# Patient Record
Sex: Female | Born: 1940 | Race: White | Hispanic: No | Marital: Single | Smoking: Current every day smoker
Health system: Southern US, Community
[De-identification: ages and names within clinical notes are randomized; demographics above are authoritative.]

## PROBLEM LIST (undated history)

## (undated) DIAGNOSIS — C3490 Malignant neoplasm of unspecified part of unspecified bronchus or lung: Secondary | ICD-10-CM

## (undated) DIAGNOSIS — Z923 Personal history of irradiation: Secondary | ICD-10-CM

## (undated) HISTORY — DX: Personal history of irradiation: Z92.3

## (undated) HISTORY — PX: BACK SURGERY: SHX140

---

## 2016-07-30 ENCOUNTER — Emergency Department (HOSPITAL_BASED_OUTPATIENT_CLINIC_OR_DEPARTMENT_OTHER): Payer: Medicare Other

## 2016-07-30 ENCOUNTER — Encounter (HOSPITAL_BASED_OUTPATIENT_CLINIC_OR_DEPARTMENT_OTHER): Payer: Self-pay

## 2016-07-30 ENCOUNTER — Inpatient Hospital Stay (HOSPITAL_BASED_OUTPATIENT_CLINIC_OR_DEPARTMENT_OTHER)
Admission: EM | Admit: 2016-07-30 | Discharge: 2016-08-03 | DRG: 054 | Disposition: A | Discharge status: Home / Self Care | Payer: Medicare Other | Attending: Internal Medicine | Admitting: Internal Medicine

## 2016-07-30 DIAGNOSIS — G131 Other systemic atrophy primarily affecting central nervous system in neoplastic disease: Secondary | ICD-10-CM | POA: Diagnosis present

## 2016-07-30 DIAGNOSIS — Z8542 Personal history of malignant neoplasm of other parts of uterus: Secondary | ICD-10-CM | POA: Diagnosis not present

## 2016-07-30 DIAGNOSIS — C7951 Secondary malignant neoplasm of bone: Secondary | ICD-10-CM | POA: Diagnosis present

## 2016-07-30 DIAGNOSIS — R4702 Dysphasia: Secondary | ICD-10-CM | POA: Diagnosis present

## 2016-07-30 DIAGNOSIS — F17210 Nicotine dependence, cigarettes, uncomplicated: Secondary | ICD-10-CM | POA: Diagnosis present

## 2016-07-30 DIAGNOSIS — Z7951 Long term (current) use of inhaled steroids: Secondary | ICD-10-CM | POA: Diagnosis not present

## 2016-07-30 DIAGNOSIS — F419 Anxiety disorder, unspecified: Secondary | ICD-10-CM | POA: Diagnosis present

## 2016-07-30 DIAGNOSIS — C3411 Malignant neoplasm of upper lobe, right bronchus or lung: Secondary | ICD-10-CM | POA: Diagnosis present

## 2016-07-30 DIAGNOSIS — R4781 Slurred speech: Secondary | ICD-10-CM | POA: Diagnosis present

## 2016-07-30 DIAGNOSIS — C787 Secondary malignant neoplasm of liver and intrahepatic bile duct: Secondary | ICD-10-CM | POA: Diagnosis present

## 2016-07-30 DIAGNOSIS — R1319 Other dysphagia: Secondary | ICD-10-CM | POA: Diagnosis not present

## 2016-07-30 DIAGNOSIS — G936 Cerebral edema: Secondary | ICD-10-CM | POA: Diagnosis present

## 2016-07-30 DIAGNOSIS — C7931 Secondary malignant neoplasm of brain: Secondary | ICD-10-CM | POA: Diagnosis not present

## 2016-07-30 DIAGNOSIS — G893 Neoplasm related pain (acute) (chronic): Secondary | ICD-10-CM | POA: Diagnosis present

## 2016-07-30 DIAGNOSIS — Z90710 Acquired absence of both cervix and uterus: Secondary | ICD-10-CM | POA: Diagnosis not present

## 2016-07-30 DIAGNOSIS — K224 Dyskinesia of esophagus: Secondary | ICD-10-CM | POA: Diagnosis present

## 2016-07-30 DIAGNOSIS — G9340 Encephalopathy, unspecified: Secondary | ICD-10-CM | POA: Diagnosis not present

## 2016-07-30 DIAGNOSIS — R1310 Dysphagia, unspecified: Secondary | ICD-10-CM

## 2016-07-30 DIAGNOSIS — R471 Dysarthria and anarthria: Secondary | ICD-10-CM | POA: Diagnosis present

## 2016-07-30 DIAGNOSIS — Z91041 Radiographic dye allergy status: Secondary | ICD-10-CM

## 2016-07-30 DIAGNOSIS — R9089 Other abnormal findings on diagnostic imaging of central nervous system: Secondary | ICD-10-CM | POA: Diagnosis present

## 2016-07-30 DIAGNOSIS — R410 Disorientation, unspecified: Secondary | ICD-10-CM

## 2016-07-30 DIAGNOSIS — J449 Chronic obstructive pulmonary disease, unspecified: Secondary | ICD-10-CM | POA: Diagnosis present

## 2016-07-30 DIAGNOSIS — T380X5A Adverse effect of glucocorticoids and synthetic analogues, initial encounter: Secondary | ICD-10-CM | POA: Diagnosis present

## 2016-07-30 DIAGNOSIS — C3490 Malignant neoplasm of unspecified part of unspecified bronchus or lung: Secondary | ICD-10-CM | POA: Diagnosis present

## 2016-07-30 DIAGNOSIS — I10 Essential (primary) hypertension: Secondary | ICD-10-CM | POA: Diagnosis present

## 2016-07-30 DIAGNOSIS — Z79899 Other long term (current) drug therapy: Secondary | ICD-10-CM

## 2016-07-30 DIAGNOSIS — R739 Hyperglycemia, unspecified: Secondary | ICD-10-CM | POA: Diagnosis present

## 2016-07-30 DIAGNOSIS — F329 Major depressive disorder, single episode, unspecified: Secondary | ICD-10-CM | POA: Diagnosis present

## 2016-07-30 DIAGNOSIS — R4789 Other speech disturbances: Secondary | ICD-10-CM | POA: Diagnosis not present

## 2016-07-30 DIAGNOSIS — J910 Malignant pleural effusion: Secondary | ICD-10-CM | POA: Diagnosis present

## 2016-07-30 DIAGNOSIS — R4701 Aphasia: Secondary | ICD-10-CM | POA: Diagnosis present

## 2016-07-30 HISTORY — DX: Malignant neoplasm of unspecified part of unspecified bronchus or lung: C34.90

## 2016-07-30 LAB — DIFFERENTIAL
BASOS ABS: 0 10*3/uL (ref 0.0–0.1)
Basophils Relative: 0 %
Eosinophils Absolute: 0.2 10*3/uL (ref 0.0–0.7)
Eosinophils Relative: 2 %
LYMPHS ABS: 2.5 10*3/uL (ref 0.7–4.0)
LYMPHS PCT: 25 %
MONOS PCT: 7 %
Monocytes Absolute: 0.7 10*3/uL (ref 0.1–1.0)
NEUTROS ABS: 6.8 10*3/uL (ref 1.7–7.7)
Neutrophils Relative %: 66 %

## 2016-07-30 LAB — COMPREHENSIVE METABOLIC PANEL
ALK PHOS: 54 U/L (ref 38–126)
ALT: 11 U/L — AB (ref 14–54)
AST: 19 U/L (ref 15–41)
Albumin: 3.7 g/dL (ref 3.5–5.0)
Anion gap: 10 (ref 5–15)
BILIRUBIN TOTAL: 0.3 mg/dL (ref 0.3–1.2)
BUN: 19 mg/dL (ref 6–20)
CHLORIDE: 101 mmol/L (ref 101–111)
CO2: 27 mmol/L (ref 22–32)
CREATININE: 0.84 mg/dL (ref 0.44–1.00)
Calcium: 9.1 mg/dL (ref 8.9–10.3)
GFR calc Af Amer: >60 mL/min (ref >60)
Glucose, Bld: 148 mg/dL — ABNORMAL HIGH (ref 65–99)
Potassium: 4.1 mmol/L (ref 3.5–5.1)
Sodium: 138 mmol/L (ref 135–145)
TOTAL PROTEIN: 7.1 g/dL (ref 6.5–8.1)

## 2016-07-30 LAB — PROTIME-INR
INR: 0.97
Prothrombin Time: 12.9 seconds (ref 11.4–15.2)

## 2016-07-30 LAB — URINALYSIS, ROUTINE W REFLEX MICROSCOPIC
BILIRUBIN URINE: NEGATIVE
GLUCOSE, UA: NEGATIVE mg/dL
HGB URINE DIPSTICK: NEGATIVE
Ketones, ur: 5 mg/dL — AB
Leukocytes, UA: NEGATIVE
Nitrite: NEGATIVE
PH: 5 (ref 5.0–8.0)
Protein, ur: NEGATIVE mg/dL
SPECIFIC GRAVITY, URINE: 1.016 (ref 1.005–1.030)

## 2016-07-30 LAB — RAPID URINE DRUG SCREEN, HOSP PERFORMED
Amphetamines: NOT DETECTED
BARBITURATES: NOT DETECTED
Benzodiazepines: NOT DETECTED
Cocaine: NOT DETECTED
Opiates: POSITIVE — AB
TETRAHYDROCANNABINOL: NOT DETECTED

## 2016-07-30 LAB — TROPONIN I

## 2016-07-30 LAB — ETHANOL: Alcohol, Ethyl (B): <5 mg/dL (ref <5)

## 2016-07-30 LAB — CBG MONITORING, ED: Glucose-Capillary: 139 mg/dL — ABNORMAL HIGH (ref 65–99)

## 2016-07-30 LAB — CBC
HEMATOCRIT: 35.8 % — AB (ref 36.0–46.0)
HEMOGLOBIN: 11.6 g/dL — AB (ref 12.0–15.0)
MCH: 31.1 pg (ref 26.0–34.0)
MCHC: 32.4 g/dL (ref 30.0–36.0)
MCV: 96 fL (ref 78.0–100.0)
Platelets: 224 10*3/uL (ref 150–400)
RBC: 3.73 MIL/uL — ABNORMAL LOW (ref 3.87–5.11)
RDW: 13.5 % (ref 11.5–15.5)
WBC: 10.3 10*3/uL (ref 4.0–10.5)

## 2016-07-30 LAB — APTT: APTT: 40 s — AB (ref 24–36)

## 2016-07-30 MED ORDER — SODIUM CHLORIDE 0.9% FLUSH: 3.0000 mL | INTRAVENOUS | Status: DC | PRN

## 2016-07-30 MED ORDER — FENTANYL 25 MCG/HR TD PT72
25.0000 ug | MEDICATED_PATCH | TRANSDERMAL | Status: DC
  Administered 2016-07-30 – 2016-08-02 (×2): 25 ug via TRANSDERMAL
  Filled 2016-07-30 (×2): qty 1

## 2016-07-30 MED ORDER — ALPRAZOLAM 0.5 MG PO TABS
0.5000 mg | ORAL_TABLET | Freq: Once | ORAL | Status: AC
  Administered 2016-07-31: 0.5 mg via ORAL
  Filled 2016-07-30: qty 1

## 2016-07-30 MED ORDER — PREGABALIN 75 MG PO CAPS
75.0000 mg | ORAL_CAPSULE | Freq: Two times a day (BID) | ORAL | Status: DC
  Administered 2016-07-30 – 2016-08-03 (×8): 75 mg via ORAL
  Filled 2016-07-30 (×8): qty 1

## 2016-07-30 MED ORDER — DEXAMETHASONE SODIUM PHOSPHATE 10 MG/ML IJ SOLN
10.0000 mg | Freq: Once | INTRAMUSCULAR | Status: AC
  Administered 2016-07-30: 10 mg via INTRAVENOUS
  Filled 2016-07-30: qty 1

## 2016-07-30 MED ORDER — HYDROCODONE-ACETAMINOPHEN 7.5-325 MG PO TABS
1.0000 | ORAL_TABLET | Freq: Four times a day (QID) | ORAL | Status: DC | PRN
  Administered 2016-07-31 – 2016-08-02 (×5): 1 via ORAL
  Filled 2016-07-30 (×5): qty 1

## 2016-07-30 MED ORDER — SODIUM CHLORIDE 0.9% FLUSH
3.0000 mL | Freq: Two times a day (BID) | INTRAVENOUS | Status: DC
  Administered 2016-07-30 – 2016-08-03 (×8): 3 mL via INTRAVENOUS

## 2016-07-30 MED ORDER — DEXAMETHASONE SODIUM PHOSPHATE 10 MG/ML IJ SOLN
10.0000 mg | Freq: Four times a day (QID) | INTRAMUSCULAR | Status: AC
  Administered 2016-07-30 – 2016-08-02 (×13): 10 mg via INTRAVENOUS
  Filled 2016-07-30 (×14): qty 1

## 2016-07-30 MED ORDER — BUSPIRONE HCL 5 MG PO TABS
10.0000 mg | ORAL_TABLET | Freq: Two times a day (BID) | ORAL | Status: DC | PRN
  Administered 2016-08-01: 10 mg via ORAL
  Filled 2016-07-30: qty 2

## 2016-07-30 MED ORDER — MONTELUKAST SODIUM 10 MG PO TABS
10.0000 mg | ORAL_TABLET | Freq: Every day | ORAL | Status: DC
  Administered 2016-07-31 – 2016-08-03 (×4): 10 mg via ORAL
  Filled 2016-07-30 (×4): qty 1

## 2016-07-30 MED ORDER — SODIUM CHLORIDE 0.9 % IV SOLN: 250.0000 mL | INTRAVENOUS | Status: DC | PRN

## 2016-07-30 MED ORDER — DULOXETINE HCL 60 MG PO CPEP
60.0000 mg | ORAL_CAPSULE | Freq: Every day | ORAL | Status: DC
  Administered 2016-07-31 – 2016-08-03 (×4): 60 mg via ORAL
  Filled 2016-07-30 (×4): qty 1

## 2016-07-30 NOTE — ED Notes (Signed)
Report to RN at [facility].

## 2016-07-30 NOTE — Progress Notes (Signed)
[] Hide copied text [] Hover for attribution information  [place]  [phone] [phone]     ID: Ashley Rowe DOB: [birth year]  MR#: [id number]  CNO#:[id number]  Patient Care Team: Henderson Baltimore, FNP as PCP - General (Nurse Practitioner) Chauncey Cruel, MD OTHER MD:  CHIEF COMPLAINT: brain metastases  CURRENT TREATMENT: dexamethasone; awaiting radiaiton therapy   HISTORY OF CURRENT ILLNESS: Ashley Rowe was living near [place], [place] when she developed worsening SOB and cough. After ABX therapy failed to clear the presumed pneumonia a chest Ct showed R hilar and RUL masses, extensive regional adnopathy, and apparent involvement of the liver and bones. 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 Clinically this was T4 N3 M1 at presentation. She was treated with standard carboplatin/ etoposide for 6 cycles, completed 04/16/2016 and most recent staging studies (Chest/Abd/Pelvis CT and bone scan 07/21/2016) showed post-treatment changes in the Right lung, an enlarging but still small/moderate Right effusion, multiple bone lesions and no obvious liver involvement.   I do not find a prior head CT scan or MRI  The patient is now living with her daughter Ashley Rowe in [place]. This AM Ashley Rowe thought her mother seemed slightly confused early AM. Ashley Rowe went to work but returned home at noon and by that time her mother's confusion was more pronounced. She was evaluated in the ED in [facility] and referred to [facility] for admission since there was no availability in [facility]. Here non-contrast head CT scanning found extensive cerebral edema with underlying isodense masses suspected, 3  Mm left to right shift, likely skull involvement, no bleed.  We were consulted to facilitate further management  INTERVAL HISTORY: I met with the patient's daughter Ashley Rowe as the patient herself had been sedated.  REVIEW OF SYSTEMS: As noted above. Dr Huff's  note from 07/23/2016 lists chronic back pain, anxiety, alcoholic polyneuropathy, and ongoing tobacco abuse.  PAST MEDICAL HISTORY:     Past Medical History:  Diagnosis Date  . Lung cancer [facility])   Remote endometrial cancer, s/p hysterectomy  PAST SURGICAL HISTORY:      Past Surgical History:  Procedure Laterality Date  . BACK SURGERY      FAMILY HISTORY No family history on file.  The patient's father committed suicide when the patient was 76 y/o. The patient's mother died from an overdose (unknown medication) age 74. The patient has one brother, with a history of what sounds like a primary CNS tumor (recently recurrent) and a sister, both living in [place]:  The patient lived in [place], near [place], [place], until she moved in with her daughter Ashley Rowe in [place]. The patient is divorced. 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 The patient had a son who was murdered. The patient has one grandchild and two great grandchildren.                          ADVANCED DIRECTIVES: not in place   HEALTH MAINTENANCE:     Social History  Substance Use Topics  . Smoking status: Current Every Day Smoker  . Smokeless tobacco: Never Used  . Alcohol use No                      Allergies  Allergen Reactions  . Ivp Dye [Iodinated Diagnostic Agents]  Current Facility-Administered Medications  Medication Dose Route Frequency Provider Last Rate Last Dose  . 0.9 %  sodium chloride infusion  250 mL Intravenous PRN Phillips Grout, MD      . ALPRAZolam Duanne Moron) tablet 0.5 mg  0.5 mg Oral Once Phillips Grout, MD      . busPIRone (BUSPAR) tablet 10 mg  10 mg Oral BID PRN Phillips Grout, MD      . dexamethasone (DECADRON) injection 10 mg  10 mg Intravenous Q6H Phillips Grout, MD      . Derrill Memo ON 07/31/2016] DULoxetine (CYMBALTA) DR capsule 60 mg  60 mg  Oral Daily Derrill Kay A, MD      . fentaNYL ([facility] - dosed mcg/hr) patch 25 mcg  25 mcg Transdermal Q72H Phillips Grout, MD      . HYDROcodone-acetaminophen ([place]) 7.5-325 MG per tablet 1 tablet  1 tablet Oral Q6H PRN Phillips Grout, MD      . Derrill Memo ON 07/31/2016] montelukast (SINGULAIR) tablet 10 mg  10 mg Oral Daily Derrill Kay A, MD      . pregabalin (LYRICA) capsule 75 mg  75 mg Oral BID Derrill Kay A, MD      . sodium chloride flush (NS) 0.9 % injection 3 mL  3 mL Intravenous Q12H David, Rachal A, MD      . sodium chloride flush (NS) 0.9 % injection 3 mL  3 mL Intravenous PRN Phillips Grout, MD        OBJECTIVE: elderly woman evaluated in bed     Vitals:   07/30/16 1800 07/30/16 1915  BP: 129/70 (!) 156/76  Pulse: 75 81  Resp: 14 16  Temp:  99.3 F (37.4 C)     Body mass index is 26.4 kg/m.       LAB RESULTS:  CMP     Labs (Brief)          Component Value Date/Time   NA 138 07/30/2016 1459   K 4.1 07/30/2016 1459   CL 101 07/30/2016 1459   CO2 27 07/30/2016 1459   GLUCOSE 148 (H) 07/30/2016 1459   BUN 19 07/30/2016 1459   CREATININE 0.84 07/30/2016 1459   CALCIUM 9.1 07/30/2016 1459   PROT 7.1 07/30/2016 1459   ALBUMIN 3.7 07/30/2016 1459   AST 19 07/30/2016 1459   ALT 11 (L) 07/30/2016 1459   ALKPHOS 54 07/30/2016 1459   BILITOT 0.3 07/30/2016 1459   GFRNONAA >60 07/30/2016 1459   GFRAA >60 07/30/2016 1459      Recent Labs  No results found for: TOTALPROTELP, ALBUMINELP, A1GS, A2GS, BETS, BETA2SER, GAMS, MSPIKE, SPEI    Recent Labs  No results found for: KPAFRELGTCHN, LAMBDASER, KAPLAMBRATIO    Recent Labs       Lab Results  Component Value Date   WBC 10.3 07/30/2016   NEUTROABS 6.8 07/30/2016   HGB 11.6 (L) 07/30/2016   HCT 35.8 (L) 07/30/2016   MCV 96.0 07/30/2016   PLT 224 07/30/2016      @LASTCHEMISTRY @  Recent Labs  No results found for: LABCA2    Recent Labs  No components found  for: [id number]     Last Labs    Recent Labs Lab 07/30/16 1459  INR 0.97      Urinalysis Labs (Brief)  No results found for: COLORURINE, APPEARANCEUR, LABSPEC, PHURINE, GLUCOSEU, HGBUR, BILIRUBINUR, KETONESUR, PROTEINUR, UROBILINOGEN, NITRITE, LEUKOCYTESUR     STUDIES:  Imaging Results  Ct Head Wo Contrast  Result Date: 07/30/2016 CLINICAL DATA:  Slurred speech with unsteady gait.  Lung cancer. EXAM: CT HEAD WITHOUT CONTRAST TECHNIQUE: Contiguous axial images were obtained from the base of the skull through the vertex without intravenous contrast. COMPARISON:  None. FINDINGS: Brain: Widespread areas of vasogenic edema are seen throughout both cerebral hemispheres, or extensive on the LEFT. There are suspected underlying isodense masses, representing metastatic disease the brain. There may be early edema in the RIGHT cerebellum. Early LEFT-to-RIGHT shift supratentorial midline, estimated 2-3 mm. No hemorrhage. Small calcification LEFT middle cerebellar peduncle, uncertain significance. Chronic lacunar infarct LEFT thalamus. No incipient herniation. Vascular: No hyperdense vessel or unexpected calcification. Skull: Permeative change in the diploic space of of the calvarium, concerning for metastatic disease. Permeative change also appears to involve clivus. Sinuses/Orbits: No layering fluid orbital mass. Other: None. IMPRESSION: Widespread areas of vasogenic edema on this noncontrast exam, suspected intracranial metastatic disease from lung cancer. MRI brain without and with contrast recommended for further evaluation. Findings discussed with ordering provider team. Suspected osseous disease to the calvarium and skull base. Electronically Signed   By: Staci Righter M.D.   On: 07/30/2016 15:25       ASSESSMENT: 76 y.o. [place], [place] woman admitted with confusion, new brain metastases, with a history of small cell lung cancer metastatic at presentation September 2017, treated with  standard chemotherapy (carboplatin/etoposide x6), last dose January 2018, now with extensive metastatic involvement of the brain.   PLAN: Agree with dexamethasone as ordered. Brain MRI is pending.  I discussed with the patient's daughter that Ashley Rowe cancer is not curable but it may be treatable, the treatment consisting of whole brain radiation.We discussed the possibility of worsening cognitive function or dementia aggravated by the treatment. We discussed the fact that the patient has no advanced directives and declaring a HCPOA in particular will be important.  Ashley Rowe feels she is likely to be the [facility] and that seems right as the patient has chosen to move in with her. This will have to be formalized and I have placed a SW consult to get this done.  I have also placed a radiation oncology consult and will call them in AM to alert them re this admission. However the patient's daughter is not sure she or her mother will want to be treated here-- they have an established oncologist, Dr Harlow Asa, locally and they may wish to have him continue to manage her care. I am copying him on this note.  Will follow with you while in house.    Chauncey Cruel, MD   07/30/2016 9:06 PM Medical Oncology and Hematology [facility] [address] [place], [place] [address] Tel. [phone] Fax. [phone]

## 2016-07-30 NOTE — H&P (Signed)
History and Physical    Ashley Rowe KYH:[id number] DOB: Jul 29, 1940 DOA: 07/30/2016  PCP: Henderson Baltimore, FNP  Patient coming from: home  Chief Complaint:  Trouble speaking  HPI: Ashley Rowe is a 76 y.o. female with medical history significant of lung cancer [place] stage 3 she thinks finished in jan 2017 comes in with trouble speaking since yesterday.  She denies any other neurological symptoms.  Not drooling.  No numbness/tingling anywhere.  No headache or blurred vision.  No trouble swallowing.  She went to [facility] and ct shows several lesions concerning for metastatic  Disease with associated edema.  Pt therefore transferred here for further treatment and evaluation.   Review of Systems: As per HPI otherwise 10 point review of systems negative.   Past Medical History:  Diagnosis Date  . Lung cancer [facility])     Past Surgical History:  Procedure Laterality Date  . BACK SURGERY       reports that she has been smoking.  She has never used smokeless tobacco. She reports that she does not drink alcohol or use drugs.  Allergies  Allergen Reactions  . Ivp Dye [Iodinated Diagnostic Agents]     No family history on file.  No premature CAD  Prior to Admission medications   Medication Sig Start Date End Date Taking? Authorizing Provider  ALPRAZolam Duanne Moron) 0.5 MG tablet Take 0.5 mg by mouth 2 (two) times daily.   Yes [provider]  budesonide-formoterol (SYMBICORT) 160-4.5 MCG/ACT inhaler Inhale 2 puffs into the lungs 2 (two) times daily.   Yes [provider]  DULoxetine HCl (CYMBALTA PO) Take by mouth.   Yes [provider]  fentaNYL (DURAGESIC - DOSED MCG/HR) 25 MCG/HR patch Place 25 mcg onto the skin every 3 (three) days.   Yes [provider]  HYDROcodone-acetaminophen ([place]CO) 7.5-325 MG tablet Take 1 tablet by mouth every 6 (six) hours as needed for moderate pain.   Yes [provider]  montelukast (SINGULAIR) 10 MG  tablet Take 10 mg by mouth at bedtime.   Yes [provider]  Multiple Vitamins-Minerals (MULTIVITAMIN ADULTS PO) Take by mouth.   Yes [provider]  pregabalin (LYRICA) 75 MG capsule Take 75 mg by mouth 2 (two) times daily.   Yes [provider]    Physical Exam: Vitals:   07/30/16 1700 07/30/16 1745 07/30/16 1800 07/30/16 1915  BP: (!) 147/89 (!) 152/73 129/70 (!) 156/76  Pulse: 70 75 75 81  Resp: '12 13 14 16  '$ Temp:    99.3 F (37.4 C)  TempSrc:    Oral  SpO2: 96% 94% 95% 96%  Weight:    67.6 kg (149 lb 0.5 oz)  Height:    '5\' 3"'$  (1.6 m)      Constitutional: NAD, calm, comfortable Vitals:   07/30/16 1700 07/30/16 1745 07/30/16 1800 07/30/16 1915  BP: (!) 147/89 (!) 152/73 129/70 (!) 156/76  Pulse: 70 75 75 81  Resp: '12 13 14 16  '$ Temp:    99.3 F (37.4 C)  TempSrc:    Oral  SpO2: 96% 94% 95% 96%  Weight:    67.6 kg (149 lb 0.5 oz)  Height:    '5\' 3"'$  (1.6 m)   Eyes: PERRL, lids and conjunctivae normal ENMT: Mucous membranes are moist. Posterior pharynx clear of any exudate or lesions.Normal dentition.  Neck: normal, supple, no masses, no thyromegaly Respiratory: clear to auscultation bilaterally, no wheezing, no crackles. Normal respiratory effort. No accessory muscle  use.  Cardiovascular: Regular rate and rhythm, no murmurs / rubs / gallops. No extremity edema. 2+ pedal pulses. No carotid bruits.  Abdomen: no tenderness, no masses palpated. No hepatosplenomegaly. Bowel sounds positive.  Musculoskeletal: no clubbing / cyanosis. No joint deformity upper and lower extremities. Good ROM, no contractures. Normal muscle tone.  Skin: no rashes, lesions, ulcers. No induration Neurologic: CN 2-12 grossly intact. Sensation intact, DTR normal. Strength 5/5 in all 4.  Psychiatric: Normal judgment and insight. Alert and oriented x 3. Normal mood.    Labs on Admission: I have personally reviewed following labs and imaging studies  CBC:  Recent Labs Lab  07/30/16 1459  WBC 10.3  NEUTROABS 6.8  HGB 11.6*  HCT 35.8*  MCV 96.0  PLT 425   Basic Metabolic Panel:  Recent Labs Lab 07/30/16 1459  NA 138  K 4.1  CL 101  CO2 27  GLUCOSE 148*  BUN 19  CREATININE 0.84  CALCIUM 9.1   GFR: Estimated Creatinine Clearance: 51.8 mL/min (by C-G formula based on SCr of 0.84 mg/dL). Liver Function Tests:  Recent Labs Lab 07/30/16 1459  AST 19  ALT 11*  ALKPHOS 54  BILITOT 0.3  PROT 7.1  ALBUMIN 3.7   No results for input(s): LIPASE, AMYLASE in the last 168 hours. No results for input(s): AMMONIA in the last 168 hours. Coagulation Profile:  Recent Labs Lab 07/30/16 1459  INR 0.97   Cardiac Enzymes:  Recent Labs Lab 07/30/16 1459  TROPONINI <0.03   BNP (last 3 results) No results for input(s): PROBNP in the last 8760 hours. HbA1C: No results for input(s): HGBA1C in the last 72 hours. CBG:  Recent Labs Lab 07/30/16 1457  GLUCAP 139*   Lipid Profile: No results for input(s): CHOL, HDL, LDLCALC, TRIG, CHOLHDL, LDLDIRECT in the last 72 hours. Thyroid Function Tests: No results for input(s): TSH, T4TOTAL, FREET4, T3FREE, THYROIDAB in the last 72 hours. Anemia Panel: No results for input(s): VITAMINB12, FOLATE, FERRITIN, TIBC, IRON, RETICCTPCT in the last 72 hours. Urine analysis: No results found for: COLORURINE, APPEARANCEUR, LABSPEC, PHURINE, GLUCOSEU, HGBUR, BILIRUBINUR, KETONESUR, PROTEINUR, UROBILINOGEN, NITRITE, LEUKOCYTESUR Sepsis Labs: !!!!!!!!!!!!!!!!!!!!!!!!!!!!!!!!!!!!!!!!!!!! '@LABRCNTIP'$ (procalcitonin:4,lacticidven:4) )No results found for this or any previous visit (from the past 240 hour(s)).   Radiological Exams on Admission: Ct Head Wo Contrast  Result Date: 07/30/2016 CLINICAL DATA:  Slurred speech with unsteady gait.  Lung cancer. EXAM: CT HEAD WITHOUT CONTRAST TECHNIQUE: Contiguous axial images were obtained from the base of the skull through the vertex without intravenous contrast. COMPARISON:   None. FINDINGS: Brain: Widespread areas of vasogenic edema are seen throughout both cerebral hemispheres, or extensive on the LEFT. There are suspected underlying isodense masses, representing metastatic disease the brain. There may be early edema in the RIGHT cerebellum. Early LEFT-to-RIGHT shift supratentorial midline, estimated 2-3 mm. No hemorrhage. Small calcification LEFT middle cerebellar peduncle, uncertain significance. Chronic lacunar infarct LEFT thalamus. No incipient herniation. Vascular: No hyperdense vessel or unexpected calcification. Skull: Permeative change in the diploic space of of the calvarium, concerning for metastatic disease. Permeative change also appears to involve clivus. Sinuses/Orbits: No layering fluid orbital mass. Other: None. IMPRESSION: Widespread areas of vasogenic edema on this noncontrast exam, suspected intracranial metastatic disease from lung cancer. MRI brain without and with contrast recommended for further evaluation. Findings discussed with ordering provider team. Suspected osseous disease to the calvarium and skull base. Electronically Signed   By: Staci Righter M.D.   On: 07/30/2016 15:25    Labs reviewed from [place]  records from [place] requested Case discussed with on call oncologist dr Jana Hakim  Assessment/Plan 76 yo female with h/o stage 2 lung cancer comes in with aphasia found to have new lesions with associated vasogenic edema on ct head  Principal Problem:   Slurred speech- due to edema /lesions.  Obtain MRI brain, pt requesting sedation for mri.  decadron  Active Problems:   Abnormal CT of brain- as noted   Vasogenic brain edema ([place])- have spoken to oncology on call, place on decadron.  Onc to see for further evaluation for need for XRT to brain   Lung cancer [facility])- obtain records from [place] [facility]     DVT prophylaxis: scds Code Status: full  Family Communication: none  Disposition Plan:  Per day team Consults called:   Oncology dr Jana Hakim called by myself Admission status:  admission   Delano Frate A MD [facility]  If 7PM-7AM, please contact night-coverage [email or web address] Password [facility]  07/30/2016, 7:47 PM

## 2016-07-30 NOTE — ED Provider Notes (Signed)
[facility] DEPT [facility] Provider Note   CSN: [id number] Arrival date & time: 07/30/16  1446     History   Chief Complaint Chief Complaint  Patient presents with  . Aphasia    HPI Ashley Rowe is a 76 y.o. female.  HPI  Patient is a 76 year old femalewioth PMH sig for lung cancer presenting with difficulty speaking. Patient has slurred speech today. Patient has no trouble word finding but does have slurred speech that is compatible. Patient is brought in by her daughter who she lives with. Patient daughter reports that the speech is abnormal she woke up this morning at 9 AM. Patient reports that she thinks it was abnormal last night as well. Patient said that she thinks is been abnormal for last couple weeks. Outside of the stroke TPA window.     Past Medical History:  Diagnosis Date  . Lung cancer ([place])     There are no active problems to display for this patient.   Past Surgical History:  Procedure Laterality Date  . BACK SURGERY      OB History    No data available       Home Medications    Prior to Admission medications   Not on File    Family History No family history on file.  Social History Social History  Substance Use Topics  . Smoking status: Current Every Day Smoker  . Smokeless tobacco: Never Used  . Alcohol use No     Allergies   Ivp dye [iodinated diagnostic agents]   Review of Systems Review of Systems  Constitutional: Negative for activity change.  Respiratory: Negative for shortness of breath.   Cardiovascular: Negative for chest pain.  Gastrointestinal: Negative for abdominal pain.  Neurological: Positive for speech difficulty. Negative for dizziness, facial asymmetry, weakness, light-headedness and numbness.     Physical Exam Updated Vital Signs BP (!) 146/69 (BP Location: Right Arm)   Pulse 82   Resp 20   Wt 153 lb (69.4 kg)   SpO2 97%   Physical Exam  Constitutional: She is oriented to person, place, and time.  She appears well-developed and well-nourished.  HENT:  Head: Normocephalic and atraumatic.  Eyes: Right eye exhibits no discharge.  Cardiovascular: Normal rate, regular rhythm and normal heart sounds.   No murmur heard. Pulmonary/Chest: Effort normal and breath sounds normal. She has no wheezes. She has no rales.  Abdominal: Soft. She exhibits no distension. There is no tenderness.  Neurological: She is oriented to person, place, and time.  Speech slurred.e, no slurringEqual strength bilaterally upper and lower extremities negative pronator drift. Normal sensation bilaterally. . Facial nerve tested and appears grossly normal. Alert and oriented 3.   Skin: Skin is warm and dry. She is not diaphoretic.  Psychiatric: She has a normal mood and affect.  Nursing note and vitals reviewed.    ED Treatments / Results  Labs (all labs ordered are listed, but only abnormal results are displayed) Labs Reviewed  APTT - Abnormal; Notable for the following:       Result Value   aPTT 40 (*)    All other components within normal limits  CBC - Abnormal; Notable for the following:    RBC 3.73 (*)    Hemoglobin 11.6 (*)    HCT 35.8 (*)    All other components within normal limits  CBG MONITORING, ED - Abnormal; Notable for the following:    Glucose-Capillary 139 (*)    All other components within normal limits  ETHANOL  PROTIME-INR  DIFFERENTIAL  COMPREHENSIVE METABOLIC PANEL  RAPID URINE DRUG SCREEN, HOSP PERFORMED  URINALYSIS, ROUTINE W REFLEX MICROSCOPIC  TROPONIN I    EKG  EKG Interpretation  Date/Time:  Thursday Jul 30 2016 14:56:02 EDT Ventricular Rate:  84 PR Interval:    QRS Duration: 87 QT Interval:  375 QTC Calculation: 444 R Axis:   81 Text Interpretation:  Sinus rhythm Borderline right axis deviation No previous tracing Confirmed by Maryan Rued  MD, Loree Fee ([id number]) on 07/30/2016 3:03:27 PM       Radiology Ct Head Wo Contrast  Result Date: 07/30/2016 CLINICAL DATA:   Slurred speech with unsteady gait.  Lung cancer. EXAM: CT HEAD WITHOUT CONTRAST TECHNIQUE: Contiguous axial images were obtained from the base of the skull through the vertex without intravenous contrast. COMPARISON:  None. FINDINGS: Brain: Widespread areas of vasogenic edema are seen throughout both cerebral hemispheres, or extensive on the LEFT. There are suspected underlying isodense masses, representing metastatic disease the brain. There may be early edema in the RIGHT cerebellum. Early LEFT-to-RIGHT shift supratentorial midline, estimated 2-3 mm. No hemorrhage. Small calcification LEFT middle cerebellar peduncle, uncertain significance. Chronic lacunar infarct LEFT thalamus. No incipient herniation. Vascular: No hyperdense vessel or unexpected calcification. Skull: Permeative change in the diploic space of of the calvarium, concerning for metastatic disease. Permeative change also appears to involve clivus. Sinuses/Orbits: No layering fluid orbital mass. Other: None. IMPRESSION: Widespread areas of vasogenic edema on this noncontrast exam, suspected intracranial metastatic disease from lung cancer. MRI brain without and with contrast recommended for further evaluation. Findings discussed with ordering provider team. Suspected osseous disease to the calvarium and skull base. Electronically Signed   By: Staci Righter M.D.   On: 07/30/2016 15:25    Procedures Procedures (including critical care time)  Medications Ordered in ED Medications  dexamethasone (DECADRON) injection 10 mg (not administered)     Initial Impression / Assessment and Plan / ED Course  I have reviewed the triage vital signs and the nursing notes.  Pertinent labs & imaging results that were available during my care of the patient were reviewed by me and considered in my medical decision making (see chart for details).    Patient 76 year old female with history of lung cancer last radiation done 6 months ago presenting today  with slurred speech. Patient outside stroke window. We'll do CT head, admitt for stroke workup.  3:36 PM CT shows question with mass with edema. Dex given. Pt informted.   Dr. Laverle Hobby is her oncologist at [facility].   Final Clinical Impressions(s) / ED Diagnoses   Final diagnoses:  None    New Prescriptions New Prescriptions   No medications on file     Macarthur Critchley, MD 07/30/16 1536

## 2016-07-30 NOTE — Progress Notes (Signed)
Patient presents with slurred speech. Alert, vitals stable. She will need MRI brain with and without contrast. Will need to consult Oncologist when patient arrives to [place]. Depending on MRI results might need radiation oncologist or neurology consultation. Patient received one dose of IV decadron. She will need further IV doses.   Ashley Devinney, Md.

## 2016-07-30 NOTE — ED Triage Notes (Signed)
Per daughter pt woke approx 830am-at 9am she noticed pt's speech slurred, unsteady gait-daughter left for work when she returned to check on pt her s/s were worse "speech worse, mouth droopy and slobbering"-pt taken to tx area via w/c

## 2016-07-30 NOTE — Consult Note (Signed)
[place]  [phone] [phone]     ID: Ashley Rowe DOB: [birth year]  MR#: [id number]  PPI#:[id number]  Patient Care Team: Henderson Baltimore, FNP as PCP - General (Nurse Practitioner) Chauncey Cruel, MD OTHER MD:  CHIEF COMPLAINT: brain metastases  CURRENT TREATMENT: dexamethasone; awaiting radiaiton therapy   HISTORY OF CURRENT ILLNESS: Ashley Rowe was living near [place], [place] when she developed worsening SOB and cough. After ABX therapy failed to clear the presumed pneumonia a chest Ct showed R hilar and RUL masses, extensive regional adnopathy, and apparent involvement of the liver and bones. 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 Clinically this was T4 N3 M1 at presentation. She was treated with standard carboplatin/ etoposide for 6 cycles, completed 04/16/2016 and most recent staging studies (Chest/Abd/Pelvis CT and bone scan 07/21/2016) showed post-treatment changes in the Right lung, an enlarging but still small/moderate Right effusion, multiple bone lesions and no obvious liver involvement.   I do not find a prior head CT scan or MRI  The patient is now living with her daughter Ashley Rowe in [place]. This AM Ashley Rowe thought her mother seemed slightly confused early AM. Ashley Rowe went to work but returned home at noon and by that time her mother's confusion was more pronounced. She was evaluated in the ED in [facility] and referred to [facility] for admission since there was no availability in H.P. Here non-contrast head CT scanning found extensive cerebral edema with underlying isodense masses suspected, 3  Mm left to right shift, likely skull involvement, no bleed.  We were consulted to facilitate further management  INTERVAL HISTORY: I met with the patient's daughter Ashley Rowe as the patient herself had been sedated.  REVIEW OF SYSTEMS: As noted above. Dr Huff's note from 07/23/2016 lists chronic back pain, anxiety, alcoholic  polyneuropathy, and ongoing tobacco abuse.  PAST MEDICAL HISTORY: Past Medical History:  Diagnosis Date  . Lung cancer [facility])   Remote endometrial cancer, s/p hysterectomy  PAST SURGICAL HISTORY: Past Surgical History:  Procedure Laterality Date  . BACK SURGERY      FAMILY HISTORY No family history on file.  The patient's father committed suicide when the patient was 76 y/o. The patient's mother died from an overdose (unknown medication) age 52. The patient has one brother, with a history of what sounds like a primary CNS tumor (recently recurrent) and a sister, both living in [place]:  The patient lived in [place], near [place], [place], until she moved in with her daughter Ashley Rowe in [place]. The patient is divorced. 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 The patient had a son who was murdered. The patient has one grandchild and two great grandchildren    ADVANCED DIRECTIVES: not in place   HEALTH MAINTENANCE: Social History  Substance Use Topics  . Smoking status: Current Every Day Smoker  . Smokeless tobacco: Never Used  . Alcohol use No       Allergies  Allergen Reactions  . Ivp Dye [Iodinated Diagnostic Agents]     Current Facility-Administered Medications  Medication Dose Route Frequency Provider Last Rate Last Dose  . 0.9 %  sodium chloride infusion  250 mL Intravenous PRN Phillips Grout, MD      . ALPRAZolam Duanne Moron) tablet 0.5 mg  0.5 mg Oral Once Derrill Kay A, MD      . busPIRone (BUSPAR) tablet 10 mg  10 mg  Oral BID PRN Phillips Grout, MD      . dexamethasone (DECADRON) injection 10 mg  10 mg Intravenous Q6H Phillips Grout, MD      . Derrill Memo ON 07/31/2016] DULoxetine (CYMBALTA) DR capsule 60 mg  60 mg Oral Daily Derrill Kay A, MD      . fentaNYL ([place] - dosed mcg/hr) patch 25 mcg  25 mcg Transdermal Q72H Phillips Grout, MD      .  HYDROcodone-acetaminophen ([place]) 7.5-325 MG per tablet 1 tablet  1 tablet Oral Q6H PRN Phillips Grout, MD      . Derrill Memo ON 07/31/2016] montelukast (SINGULAIR) tablet 10 mg  10 mg Oral Daily Derrill Kay A, MD      . pregabalin (LYRICA) capsule 75 mg  75 mg Oral BID Derrill Kay A, MD      . sodium chloride flush (NS) 0.9 % injection 3 mL  3 mL Intravenous Q12H David, Rachal A, MD      . sodium chloride flush (NS) 0.9 % injection 3 mL  3 mL Intravenous PRN Phillips Grout, MD        OBJECTIVE: elderly White woman evaluated in bed Vitals:   07/30/16 1800 07/30/16 1915  BP: 129/70 (!) 156/76  Pulse: 75 81  Resp: 14 16  Temp:  99.3 F (37.4 C)     Body mass index is 26.4 kg/m.       LAB RESULTS:  CMP     Component Value Date/Time   NA 138 07/30/2016 1459   K 4.1 07/30/2016 1459   CL 101 07/30/2016 1459   CO2 27 07/30/2016 1459   GLUCOSE 148 (H) 07/30/2016 1459   BUN 19 07/30/2016 1459   CREATININE 0.84 07/30/2016 1459   CALCIUM 9.1 07/30/2016 1459   PROT 7.1 07/30/2016 1459   ALBUMIN 3.7 07/30/2016 1459   AST 19 07/30/2016 1459   ALT 11 (L) 07/30/2016 1459   ALKPHOS 54 07/30/2016 1459   BILITOT 0.3 07/30/2016 1459   GFRNONAA >60 07/30/2016 1459   GFRAA >60 07/30/2016 1459    No results found for: TOTALPROTELP, ALBUMINELP, A1GS, A2GS, BETS, BETA2SER, GAMS, MSPIKE, SPEI  No results found for: KPAFRELGTCHN, LAMBDASER, KAPLAMBRATIO  Lab Results  Component Value Date   WBC 10.3 07/30/2016   NEUTROABS 6.8 07/30/2016   HGB 11.6 (L) 07/30/2016   HCT 35.8 (L) 07/30/2016   MCV 96.0 07/30/2016   PLT 224 07/30/2016    @LASTCHEMISTRY @  No results found for: LABCA2  No components found for: LTRVUY233   Recent Labs Lab 07/30/16 1459  INR 0.97    Urinalysis No results found for: COLORURINE, APPEARANCEUR, LABSPEC, PHURINE, GLUCOSEU, HGBUR, BILIRUBINUR, KETONESUR, PROTEINUR, UROBILINOGEN, NITRITE, LEUKOCYTESUR   STUDIES: Ct Head Wo Contrast  Result Date:  07/30/2016 CLINICAL DATA:  Slurred speech with unsteady gait.  Lung cancer. EXAM: CT HEAD WITHOUT CONTRAST TECHNIQUE: Contiguous axial images were obtained from the base of the skull through the vertex without intravenous contrast. COMPARISON:  None. FINDINGS: Brain: Widespread areas of vasogenic edema are seen throughout both cerebral hemispheres, or extensive on the LEFT. There are suspected underlying isodense masses, representing metastatic disease the brain. There may be early edema in the RIGHT cerebellum. Early LEFT-to-RIGHT shift supratentorial midline, estimated 2-3 mm. No hemorrhage. Small calcification LEFT middle cerebellar peduncle, uncertain significance. Chronic lacunar infarct LEFT thalamus. No incipient herniation. Vascular: No hyperdense vessel or unexpected calcification. Skull: Permeative change in the diploic space of of the calvarium, concerning for metastatic disease.  Permeative change also appears to involve clivus. Sinuses/Orbits: No layering fluid orbital mass. Other: None. IMPRESSION: Widespread areas of vasogenic edema on this noncontrast exam, suspected intracranial metastatic disease from lung cancer. MRI brain without and with contrast recommended for further evaluation. Findings discussed with ordering provider team. Suspected osseous disease to the calvarium and skull base. Electronically Signed   By: Staci Righter M.D.   On: 07/30/2016 15:25    ASSESSMENT: 76 y.o. [place], [place] woman admitted with confusion, new brain metastases, with a history of small cell lung cancer metastatic at presentation September 2017, treated with standard chemotherapy (carboplatin/etoposide x6), last dose January 2018, now with extensive metastatic involvement of the brain.   PLAN: Agree with dexamethasone as ordered. Brain MRI is pending.  I discussed with the patient's daughter that Ashley Blankenbeckler cancer is not curable but it may be treatable, the treatment consisting of whole brain  radiation.We discussed the possibility of worsening cognitive function or dementia aggravated by the treatment. We discussed the fact that the patient has no advanced directives and declaring a HCPOA in particular will be important.  Ashley Rowe feels she is likely to be the [facility] and that seems right as the patient has chosen to move in with her. This will have to be formalized and I have placed a SW consult to get this done.  I have also placed a radiation oncology consult and will call them in AM to alert them re this admission. However the patient's daughter is not sure she or her mother will want to be treated here-- they have an established oncologist, Dr Harlow Asa, locally and they may wish to have him continue to manage her care. I am copying him on this note.  Will follow with you while in house.   Chauncey Cruel, MD   07/30/2016 9:06 PM Medical Oncology and Hematology [facility] [address] [place], [place] [address] Tel. [phone]    Fax. [phone]

## 2016-07-31 ENCOUNTER — Inpatient Hospital Stay (HOSPITAL_COMMUNITY): Payer: Medicare Other

## 2016-07-31 ENCOUNTER — Ambulatory Visit
Admit: 2016-07-31 | Discharge: 2016-07-31 | Disposition: A | Discharge status: Home / Self Care | Payer: Medicare Other | Attending: Urology | Admitting: Urology

## 2016-07-31 ENCOUNTER — Other Ambulatory Visit: Payer: Self-pay | Admitting: Oncology

## 2016-07-31 ENCOUNTER — Ambulatory Visit
Admit: 2016-07-31 | Discharge: 2016-07-31 | Disposition: A | Discharge status: Home / Self Care | Payer: Medicare Other | Attending: Radiation Oncology | Admitting: Radiation Oncology

## 2016-07-31 DIAGNOSIS — Z510 Encounter for antineoplastic radiation therapy: Secondary | ICD-10-CM | POA: Insufficient documentation

## 2016-07-31 DIAGNOSIS — R4702 Dysphasia: Secondary | ICD-10-CM

## 2016-07-31 DIAGNOSIS — G936 Cerebral edema: Secondary | ICD-10-CM

## 2016-07-31 DIAGNOSIS — C3490 Malignant neoplasm of unspecified part of unspecified bronchus or lung: Secondary | ICD-10-CM

## 2016-07-31 DIAGNOSIS — C7931 Secondary malignant neoplasm of brain: Secondary | ICD-10-CM

## 2016-07-31 DIAGNOSIS — J910 Malignant pleural effusion: Secondary | ICD-10-CM

## 2016-07-31 DIAGNOSIS — R471 Dysarthria and anarthria: Secondary | ICD-10-CM

## 2016-07-31 DIAGNOSIS — G9340 Encephalopathy, unspecified: Secondary | ICD-10-CM

## 2016-07-31 MED ORDER — GADOBENATE DIMEGLUMINE 529 MG/ML IV SOLN
15.0000 mL | Freq: Once | INTRAVENOUS | Status: AC | PRN
  Administered 2016-07-31: 14 mL via INTRAVENOUS

## 2016-07-31 MED ORDER — ORAL CARE MOUTH RINSE
15.0000 mL | Freq: Two times a day (BID) | OROMUCOSAL | Status: DC
  Administered 2016-07-31 – 2016-08-03 (×6): 15 mL via OROMUCOSAL

## 2016-07-31 NOTE — Progress Notes (Signed)
  Speech Language Pathology   Patient Details Name: Ashley Rowe MRN: [id number] DOB: 09/05/1940 Today's Date: 07/31/2016 Time: 1210-1227 SLP Time Calculation (min) (ACUTE ONLY): 17 min   MBS completed. Full report will be documented Recommend: continue regular texture, thin liquids, straws allowed              Houston Siren 07/31/2016, 1:01 PM Orbie Pyo Colvin Caroli[employer] [phone]

## 2016-07-31 NOTE — Care Management Note (Signed)
Case Management Note  Patient Details  Name: Ashley Rowe MRN: [id number] Date of Birth: 1940/03/27  Subjective/Objective:76 y/o f admitted w/Slurred speech. Hx: Lung Ca. From home.                    Action/Plan:d/c home.   Expected Discharge Date:                  Expected Discharge Plan:  Home/Self Care  In-House Referral:     Discharge planning Services  CM Consult  Post Acute Care Choice:    Choice offered to:     DME Arranged:    DME Agency:     HH Arranged:    HH Agency:     Status of Service:  In process, will continue to follow  If discussed at Long Length of Stay Meetings, dates discussed:    Additional Comments:  Dessa Phi, RN 07/31/2016, 2:31 PM

## 2016-07-31 NOTE — Progress Notes (Signed)
  Radiation Oncology         [phone] ________________________________  Name: Ashley Rowe MRN: [id number]  Date: 07/31/2016  DOB: 11-21-1940    Simulation and treatment planning note  DIAGNOSIS:     ICD-9-CM ICD-10-CM   1. Brain metastasis ([place]) 198.3 C79.31      The patient presented for simulation for the patient's upcoming course of whole brain radiation treatment. The patient was placed in a supine position and a customized thermoplastic head cast was constructed to aid in patient immobilization during the treatment. This complex treatment device will be used on a daily basis. In this fashion a CT scan was obtained through the head and neck region and isocenter was placed near midline within the brain.  The patient will be planned to receive a course of whole brain radiation treatment to a dose of 30 gray in 10 fractions at 3 gray per fraction. To accomplish this, 2 customized blocks have been designed which corresponds to left and right whole brain radiation fields. These 2 complex treatment devices will be used on a daily basis during the course of radiation. A complex isodose plan is requested to insure that the target area is adequately covered in to facilitate optimization of the treatment plan. A forward planning technique will also be evaluated to determine if this approach significantly improves the plan.   ________________________________   Jodelle Gross, MD, PhD

## 2016-07-31 NOTE — Progress Notes (Signed)
CSW consulted to assist with Advanced Directives. Please consult [place] services for assistance.   Werner Lean LCSW [phone]

## 2016-07-31 NOTE — Evaluation (Signed)
Clinical/Bedside Swallow Evaluation Patient Details  Name: Ashley Rowe MRN: [id number] Date of Birth: [birth year]  Today's Date: 07/31/2016 Time: SLP Start Time (ACUTE ONLY): 0843 SLP Stop Time (ACUTE ONLY): 0913 SLP Time Calculation (min) (ACUTE ONLY): 30 min  Past Medical History:  Past Medical History:  Diagnosis Date  . Lung cancer [facility])    Past Surgical History:  Past Surgical History:  Procedure Laterality Date  . BACK SURGERY     HPI:  Ashley Rowe a 76 y.o.femalewith medical history significant of lung cancer treated at [facility].  She thinks she finished treatment in January 2017.  She comes in with trouble speaking since yesterday. She denies any other neurological symptoms. No drooling. No numbness/tingling anywhere. No headache or blurred vision. She went to [facility] and CT head shows widespread areas of vasogenic edema, suspected intracranial metastic disease from known lung cancer.  Pt therefore transferred here for further treatment and evaluation.   Nursing reported that the patient has crackles at the left lung base that are new.  Patient c/o issues swallowing with material sticking and intermittent regurgitation.  She is vague as to whether symtoms are worse with food or liquids.     Assessment / Plan / Recommendation Clinical Impression  Clinical swallowing evaluation was completed using thin liquids, pureed material and soft solids.  Oral mechanism exam was completed and remarkable for decreased lingual strength on the right.  The patient complains of trouble swallowing with materials not wanting to go down.  She reports that she has been having issues for a while but they just became worse to the point that she has to intermittently regurgitate food/liquids.  She is vague as to whether she has more issues with food or liquids.  Clinically the patient was slow orally to masticate soft solids and appeared at times to hold the bolus orally prior to initation of  a swallow.  Inconsistently delayed swallow trigger was also observed.  Inconsistent throat clear/cough noted mostly on thin liquids as well as intermittent regurgitation of primarly liquid boluses.  It is difficult to discern if this is an oropharyngeal dysphagia or esophageal dysphagia.  Given clinical presentation and patient history of lung cancer with metastasis to the brain recommend an MBS to determine current swallowing physiology and least restrictive diet.    SLP Visit Diagnosis: Dysphagia, oropharyngeal phase (R13.12)    Aspiration Risk  Mild aspiration risk    Diet Recommendation   Continue current diet pending results of MBS.    Medication Administration: Whole meds with liquid    Other  Recommendations Oral Care Recommendations: Oral care BID   Follow up Recommendations Other (comment) (TBD)        Swallow Study   General Date of Onset: 44/31/54 HPI: Ashley Rowe a 76 y.o.femalewith medical history significant of lung cancer treated at [facility].  She thinks finished treatment in January 2017.  She comes in with trouble speaking since yesterday. She denies any other neurological symptoms. Not drooling. No numbness/tingling anywhere. No headache or blurred vision. She went to [facility] and CT head shows widespread areas of vasogenic edema, suspected intracranial metastic disease from known lung cancer.  Pt therefore transferred here for further treatment and evaluation.   Nursing reported that the patient has crackles at the left lung base that are new.  Patient c/o issues swallwoing with material sticking.  She is vague as to whether symtoms are worse with food or liquids.   Type of Study: Bedside Swallow Evaluation  Previous Swallow Assessment: None at [facility].   Diet Prior to this Study: Regular;Thin liquids Temperature Spikes Noted:  (One low grade temp since admission.  ) History of Recent Intubation: No Behavior/Cognition: Alert;Cooperative;Pleasant mood;Other  (Comment) (mildly confused.  ) Oral Cavity Assessment: Within Functional Limits Oral Care Completed by SLP: No Oral Cavity - Dentition: Dentures, top;Dentures, bottom Vision: Functional for self-feeding Self-Feeding Abilities: Able to feed self Patient Positioning: Upright in bed Baseline Vocal Quality: Normal Volitional Cough: Weak Volitional Swallow: Unable to elicit    Oral/Motor/Sensory Function Overall Oral Motor/Sensory Function: Mild impairment Facial ROM: Within Functional Limits Facial Symmetry: Within Functional Limits Facial Strength: Within Functional Limits Lingual ROM: Within Functional Limits Lingual Symmetry: Abnormal symmetry right Lingual Strength: Reduced Mandible: Within Functional Limits   Ice Chips Ice chips: Impaired Presentation: Spoon Pharyngeal Phase Impairments: Suspected delayed Swallow   Thin Liquid Thin Liquid: Impaired Presentation: Cup;Spoon;Self Fed Pharyngeal  Phase Impairments: Suspected delayed Swallow;Cough - Immediate;Throat Clearing - Immediate (Incosnistent throat clearing/coughing, intermittent regurge)    Nectar Thick Nectar Thick Liquid: Not tested   Honey Thick Honey Thick Liquid: Not tested   Puree Puree: Impaired Presentation: Spoon Pharyngeal Phase Impairments: Suspected delayed Swallow   Solid   GO   Solid: Impaired Presentation: Spoon Oral Phase Impairments: Impaired mastication Oral Phase Functional Implications: Prolonged oral transit Pharyngeal Phase Impairments: Suspected delayed Swallow        Shelly Flatten, MA, CCC-SLP Acute Rehab SLP [phone] Lamar Sprinkles 07/31/2016,9:27 AM

## 2016-07-31 NOTE — Progress Notes (Signed)
Met with patient (eating lunch), daughter, and daughters husband Delfino Lovett. Discussed result of brain MRI and plan for radiation. They understand treatment is not curative and prognosis is guarded.  Patient was able to tell me (w/o difficulty) the year, her daughter's name, and where she is. Accordingly I feel she is competent to complete a HCPOA document. She intends to name her daughter Juliann Pulse as Chauncey Reading.  Please let me know if I can be of further help at this point. Otherwise willfollow up on Monday.

## 2016-07-31 NOTE — Progress Notes (Signed)
Modified Barium Swallow Progress Note  Patient Details  Name: Ashley Rowe MRN: [id number] Date of Birth: 05-24-1940  Today's Date: 07/31/2016  Modified Barium Swallow completed.  Full report located under Chart Review in the Imaging Section.  Brief recommendations include the following:  Clinical Impression  Pt exhibited mild-moderate oral dysphagia marked by decreased cohesion with premature sublingal and vallecular spill, oral holding, delayed transit and mild lingual residue. Pharyngeal phase remarkable for min-mild intermittent vallecular residue clearing with spontaneous second swallows. Flash penetration x 1 with first trial of thin due to incomplete laryngeal closure. Barium pill appeared to hesitate briefly upper-mid esophagus before descending without assist. Suspect upper pharyngeal globus sensation may be due to esophageal component. Recommend pt continue regular texture, thin liquids, pills with water or if difficulty whole in puree.     Swallow Evaluation Recommendations       SLP Diet Recommendations: Regular solids;Thin liquid   Liquid Administration via: Cup;Straw   Medication Administration: Whole meds with liquid   Supervision: Patient able to self feed;Intermittent supervision to cue for compensatory strategies   Compensations: Slow rate;Small sips/bites   Postural Changes: Seated upright at 90 degrees   Oral Care Recommendations: Oral care BID        Ashley Rowe 07/31/2016,5:09 PM   Orbie Pyo Colvin Caroli[employer] [phone]

## 2016-07-31 NOTE — Progress Notes (Signed)
PROGRESS NOTE  Ashley Rowe KGU:[id number] DOB: Oct 22, 1940 DOA: 07/30/2016 PCP: No primary care provider on file.  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  On the morning of 07/30/2016, the patient appeared to be somewhat confused according to the patient's daughter. The patient's daughter returned from work at noon to check up on the patient and noted increasing confusion. As result, EMS was activated. The patient was noted to have dysarthria. CT of the brain showed widespread areas of vasogenic edema with suspected metastasis from her lung cancer. There was 2-3 mm left-to-right shift. The patient was started on intravenous dexamethasone. Medical oncology was consulted to assist.  Regarding her oncologic history, the patient was diagnosed with small cell lung cancer on 12/17/2015 via a right upper lobe core biopsy and transbronchial aspirate. She is status post 6 cycles of carboplatin and etoposide, completed 04/16/2016. Repeat staging CT chest and abdomen and pelvis on 05/27/2016 showed small to moderate right pleural effusion slightly increased with nodular areas in the right upper lobe similar to previous study. There was multiple sclerotic lesions throughout the axial and appendicular skeleton similar to prior examination. No new sites of metastatic disease noted.  Assessment/Plan: Dysphasia and dysarthria -likely due to brain mets with vasogenic edema with midline shift -remains dysphasic and dysarthric -continue dexamethasone IV -MRI brain -speech therapy eval  Acute encephalopathy -due to metastasis with vasogenic brain edema -showing some improvement with IV dexamethasone -I have consulted radiation oncology already this am -continue IV dexamethasone -Urinalysis negative for pyuria -Personally reviewed EKG--sinus rhythm, no ST-T wave  changes  Vasogenic brain edema -due to metastatic disease -spoke with patient and daughter whom are agreeable to stay at [facility] for XRT  Right-sided pleural effusion -Oxygen saturation 98-99 percent room air -Represents malignant effusion  Extensive stage small cell lung cancer -status post 6 cycles of carboplatin and etoposide, completed 04/16/2016.  -follows Dr. Jacqulyn Ducking in [place]  Cancer related pain -continue home dose hydrocodone and fentanyl TD -continue Lyrica  COPD -continues to smoke -continue [facility]  Depression/Anxiety -continue buspirone and duloxetine   Disposition Plan:   Home when cleared by radiation oncology  Family Communication:   Daughter updated 5/11--Total time spent 35 minutes.  Greater than 50% spent face to face counseling and coordinating care.   Consultants:MedOnc--[place]; RadOnc    Code Status:  FULL  DVT Prophylaxis:  SCDs   Procedures: As Listed in Progress Note Above  Antibiotics: None    Subjective:   Objective: Vitals:   07/30/16 1915 07/30/16 2209 07/31/16 0200 07/31/16 0454  BP: (!) 156/76 (!) 143/73 122/67 (!) 146/60  Pulse: 81 72 81 63  Resp: '16 16 14 16  '$ Temp: 99.3 F (37.4 C) 98.7 F (37.1 C) 98.4 F (36.9 C) 97.5 F (36.4 C)  TempSrc: Oral Oral Oral Oral  SpO2: 96% 98% 100% 95%  Weight: 67.6 kg (149 lb 0.5 oz)     Height: '5\' 3"'$  (1.6 m)       Intake/Output Summary (Last 24 hours) at 07/31/16 0821 Last data filed at 07/31/16 0656  Gross per 24 hour  Intake                0 ml  Output              150 ml  Net             -  150 ml   Weight change:  Exam:   General:  Pt is alert, follows commands appropriately, not in acute distress  HEENT: No icterus, No thrush, No neck mass, [place]/AT  Cardiovascular: RRR, S1/S2, no rubs, no gallops  Respiratory: Bibasilar crackles, regular (diminished breath sounds right base.  Abdomen: Soft/+BS, non tender, non distended, no guarding  Extremities: No  edema, No lymphangitis, No petechiae, No rashes, no synovitis   Data Reviewed: I have personally reviewed following labs and imaging studies Basic Metabolic Panel:  Recent Labs Lab 07/30/16 1459  NA 138  K 4.1  CL 101  CO2 27  GLUCOSE 148*  BUN 19  CREATININE 0.84  CALCIUM 9.1   Liver Function Tests:  Recent Labs Lab 07/30/16 1459  AST 19  ALT 11*  ALKPHOS 54  BILITOT 0.3  PROT 7.1  ALBUMIN 3.7   No results for input(s): LIPASE, AMYLASE in the last 168 hours. No results for input(s): AMMONIA in the last 168 hours. Coagulation Profile:  Recent Labs Lab 07/30/16 1459  INR 0.97   CBC:  Recent Labs Lab 07/30/16 1459  WBC 10.3  NEUTROABS 6.8  HGB 11.6*  HCT 35.8*  MCV 96.0  PLT 224   Cardiac Enzymes:  Recent Labs Lab 07/30/16 1459  TROPONINI <0.03   BNP: Invalid input(s): POCBNP CBG:  Recent Labs Lab 07/30/16 1457  GLUCAP 139*   HbA1C: No results for input(s): HGBA1C in the last 72 hours. Urine analysis:    Component Value Date/Time   COLORURINE YELLOW 07/30/2016 2151   APPEARANCEUR HAZY (A) 07/30/2016 2151   LABSPEC 1.016 07/30/2016 2151   PHURINE 5.0 07/30/2016 2151   GLUCOSEU NEGATIVE 07/30/2016 2151   HGBUR NEGATIVE 07/30/2016 2151   BILIRUBINUR NEGATIVE 07/30/2016 2151   KETONESUR 5 (A) 07/30/2016 2151   PROTEINUR NEGATIVE 07/30/2016 2151   NITRITE NEGATIVE 07/30/2016 2151   LEUKOCYTESUR NEGATIVE 07/30/2016 2151   Sepsis Labs: '@LABRCNTIP'$ (procalcitonin:4,lacticidven:4) )No results found for this or any previous visit (from the past 240 hour(s)).   Scheduled Meds: . ALPRAZolam  0.5 mg Oral Once  . dexamethasone  10 mg Intravenous Q6H  . DULoxetine  60 mg Oral Daily  . fentaNYL  25 mcg Transdermal Q72H  . mouth rinse  15 mL Mouth Rinse BID  . montelukast  10 mg Oral Daily  . pregabalin  75 mg Oral BID  . sodium chloride flush  3 mL Intravenous Q12H   Continuous Infusions: . sodium chloride      Procedures/Studies: Ct  Head Wo Contrast  Result Date: 07/30/2016 CLINICAL DATA:  Slurred speech with unsteady gait.  Lung cancer. EXAM: CT HEAD WITHOUT CONTRAST TECHNIQUE: Contiguous axial images were obtained from the base of the skull through the vertex without intravenous contrast. COMPARISON:  None. FINDINGS: Brain: Widespread areas of vasogenic edema are seen throughout both cerebral hemispheres, or extensive on the LEFT. There are suspected underlying isodense masses, representing metastatic disease the brain. There may be early edema in the RIGHT cerebellum. Early LEFT-to-RIGHT shift supratentorial midline, estimated 2-3 mm. No hemorrhage. Small calcification LEFT middle cerebellar peduncle, uncertain significance. Chronic lacunar infarct LEFT thalamus. No incipient herniation. Vascular: No hyperdense vessel or unexpected calcification. Skull: Permeative change in the diploic space of of the calvarium, concerning for metastatic disease. Permeative change also appears to involve clivus. Sinuses/Orbits: No layering fluid orbital mass. Other: None. IMPRESSION: Widespread areas of vasogenic edema on this noncontrast exam, suspected intracranial metastatic disease from lung cancer. MRI brain without and with contrast recommended  for further evaluation. Findings discussed with ordering provider team. Suspected osseous disease to the calvarium and skull base. Electronically Signed   By: Staci Righter M.D.   On: 07/30/2016 15:25    Sagar Tengan, DO  [facility] Pager [phone]  If 7PM-7AM, please contact night-coverage [email or web address] Password [email or web address] 07/31/2016, 8:21 AM   LOS: 1 day

## 2016-07-31 NOTE — Consult Note (Addendum)
Radiation Oncology         [phone] ________________________________  Name: Ashley Rowe MRN: [id number]  Date: 07/30/2016  DOB: 27-Aug-1940  CC:No primary care provider on file.  No ref. provider found     REFERRING PHYSICIAN: No ref. provider found Dr. Shanon Brow Tat   DIAGNOSIS: The encounter diagnosis was Aphasia. 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 female seen at the request of Dr. Carles Collet for evaluation of newly diagnosed brain metastases. She has a history of extensive stage small cell carcinoma of the right lung with metastases to the liver and bones that was initially diagnosed and treated in [place], [place] in 11/2015.  She relocated to [facility] in 02/2016 to live with her daughter Barnetta Chapel. She has completed 6 cycles of carboplatin/etoposide as above 04/16/2016. She is currently under the care of Dr. Jacqulyn Ducking, medical oncology at [facility]. Her most recent restaging studies showed post-treatment changes in the right lung, small/moderate right effusion, multiple bony lesions but no obvious liver involvement. The patient and her family were under the impression that she had had a brain MRI that was negative for metastatic disease in March 2018. I cannot find any record of this study.  Ms. Gassert presented to the emergency room on 07/30/2016 with confusion and dysarthria. A noncontrast CT head was performed which demonstrated widespread vasogenic edema throughout both cerebral hemispheres, more extensive on the left with suspected underlying isodense masses concerning for metastatic disease of the brain. There was a 2-49m left to right shift supratentorial midline and suspected osseous disease to the calvarium and skull base. She was started on dexamethasone 10 mg IV every 6 hours and has noticed significant improvement in her cognition and speech.  Brain MRI with and without  contrast was performed 07/31/2016 and demonstrated nine supratentorial brain metastases with prominent necrosis and thin peripheral enhancement. Vasogenic edema causing a 2 mm left to right midline shift was noted. The skull and upper cervical spine were negative.  Individual lesions as follows: Left thalamus:  21 x 14 mm. Right occipital lobe:  13 x 16 mm. Right frontal lobe, 23 x 25 mm. Right medial parietal lobe, 19 x 14 mm. Right posterior parietal lobe:  6 x 10 mm. Right posterior frontal vertex: 9 mm in diameter. Left temporal/ occipital/ parietal junction:  20 x 16 mm. Left frontal lobe:  22 x 15 mm. Left parietal lobe:  9 mm in diameter.  We are asked to consult this patient for consideration of radiotherapy in the treatment and management of her extensive brain metastases.  PREVIOUS RADIATION THERAPY: No   PAST MEDICAL HISTORY:  Past Medical History:  Diagnosis Date  . Lung cancer ([place]        PAST SURGICAL HISTORY: Past Surgical History:  Procedure Laterality Date  . BACK SURGERY       FAMILY HISTORY: No family history on file.   SOCIAL HISTORY:  reports that she has been smoking.  She has never used smokeless tobacco. She reports that she does not drink alcohol or use drugs.   ALLERGIES: Ivp dye [iodinated diagnostic agents]   MEDICATIONS:  Current Facility-Administered Medications  Medication Dose Route Frequency Provider Last Rate Last Dose  . 0.9 %  sodium chloride infusion  250 mL Intravenous PRN DDerrill KayA, MD      . busPIRone (BUSPAR) tablet 10 mg  10 mg Oral  BID PRN Phillips Grout, MD      . dexamethasone (DECADRON) injection 10 mg  10 mg Intravenous Q6H Derrill Kay A, MD   10 mg at 07/31/16 0815  . DULoxetine (CYMBALTA) DR capsule 60 mg  60 mg Oral Daily Derrill Kay A, MD   60 mg at 07/31/16 0814  . fentaNYL (DURAGESIC - dosed mcg/hr) patch 25 mcg  25 mcg Transdermal Q72H Phillips Grout, MD   25 mcg at 07/30/16 2110  .  HYDROcodone-acetaminophen (NORCO) 7.5-325 MG per tablet 1 tablet  1 tablet Oral Q6H PRN Phillips Grout, MD   1 tablet at 07/31/16 1194  . MEDLINE mouth rinse  15 mL Mouth Rinse BID Derrill Kay A, MD   15 mL at 07/31/16 1000  . montelukast (SINGULAIR) tablet 10 mg  10 mg Oral Daily Phillips Grout, MD   10 mg at 07/31/16 0814  . pregabalin (LYRICA) capsule 75 mg  75 mg Oral BID Phillips Grout, MD   75 mg at 07/31/16 0815  . sodium chloride flush (NS) 0.9 % injection 3 mL  3 mL Intravenous Q12H Derrill Kay A, MD   3 mL at 07/31/16 0815  . sodium chloride flush (NS) 0.9 % injection 3 mL  3 mL Intravenous PRN Phillips Grout, MD         REVIEW OF SYSTEMS: On review of systems, the patient reports that she is doing well overall. She denies any chest pain, shortness of breath, fevers, chills, night sweats, unintended weight changes. She has a productive cough with white sputum which has been ongoing for the past week.  She denies hemoptysis or increased dyspnea on exertion.  She denies headaches, visual changes, dizziness or difficulty with balance and walking.  She denies any bowel or bladder disturbances, and denies abdominal pain, nausea or vomiting. She has chronic low back pain but denies any new musculoskeletal or joint aches or pains. Her daughter and son-in-law are present in the room with the patient and report that her confusion and speech are much improved since starting steroids on admission.   A complete review of systems is obtained and is otherwise negative.  PHYSICAL EXAM:  Wt Readings from Last 3 Encounters:  07/30/16 149 lb 0.5 oz (67.6 kg)   Temp Readings from Last 3 Encounters:  07/31/16 97.5 F (36.4 C) (Oral)   BP Readings from Last 3 Encounters:  07/31/16 (!) 146/60   Pulse Readings from Last 3 Encounters:  07/31/16 63   Pain Assessment Pain Score: 7 /10  In general this is a well appearing caucasian female in no acute distress. She is alert and oriented x4 and  appropriate throughout the examination. HEENT reveals that the patient is normocephalic, atraumatic. EOMs are intact. PERRLA. Skin is intact without any evidence of gross lesions. Cardiovascular exam reveals a regular rate and rhythm, no clicks rubs or murmurs are auscultated. Respiratory exam reveals bibasilar crackles with decreased breath sounds bilaterally in the lung bases, R>L. Lymphatic assessment is performed and does not reveal any adenopathy in the cervical, supraclavicular, or axillary chains. Abdomen has active bowel sounds in all quadrants and is intact. The abdomen is soft, non tender, non distended. Lower extremities are negative for pretibial pitting edema, deep calf tenderness, cyanosis or clubbing.   ECOG = 3  0 - Asymptomatic (Fully active, able to carry on all predisease activities without restriction)  1 - Symptomatic but completely ambulatory (Restricted in physically strenuous activity but ambulatory and  able to carry out work of a light or sedentary nature. For example, light housework, office work)  2 - Symptomatic, <50% in bed during the day (Ambulatory and capable of all self care but unable to carry out any work activities. Up and about more than 50% of waking hours)  3 - Symptomatic, >50% in bed, but not bedbound (Capable of only limited self-care, confined to bed or chair 50% or more of waking hours)  4 - Bedbound (Completely disabled. Cannot carry on any self-care. Totally confined to bed or chair)  5 - Death   Eustace Pen MM, Creech RH, Tormey DC, et al. [phone]). "Toxicity and response criteria of the [facility]". [place] Oncol. 5 (6): 649-55    LABORATORY DATA:  Lab Results  Component Value Date   WBC 10.3 07/30/2016   HGB 11.6 (L) 07/30/2016   HCT 35.8 (L) 07/30/2016   MCV 96.0 07/30/2016   PLT 224 07/30/2016   Lab Results  Component Value Date   NA 138 07/30/2016   K 4.1 07/30/2016   CL 101 07/30/2016   CO2 27 07/30/2016    Lab Results  Component Value Date   ALT 11 (L) 07/30/2016   AST 19 07/30/2016   ALKPHOS 54 07/30/2016   BILITOT 0.3 07/30/2016      RADIOGRAPHY: Ct Head Wo Contrast  Result Date: 07/30/2016 CLINICAL DATA:  Slurred speech with unsteady gait.  Lung cancer. EXAM: CT HEAD WITHOUT CONTRAST TECHNIQUE: Contiguous axial images were obtained from the base of the skull through the vertex without intravenous contrast. COMPARISON:  None. FINDINGS: Brain: Widespread areas of vasogenic edema are seen throughout both cerebral hemispheres, or extensive on the LEFT. There are suspected underlying isodense masses, representing metastatic disease the brain. There may be early edema in the RIGHT cerebellum. Early LEFT-to-RIGHT shift supratentorial midline, estimated 2-3 mm. No hemorrhage. Small calcification LEFT middle cerebellar peduncle, uncertain significance. Chronic lacunar infarct LEFT thalamus. No incipient herniation. Vascular: No hyperdense vessel or unexpected calcification. Skull: Permeative change in the diploic space of of the calvarium, concerning for metastatic disease. Permeative change also appears to involve clivus. Sinuses/Orbits: No layering fluid orbital mass. Other: None. IMPRESSION: Widespread areas of vasogenic edema on this noncontrast exam, suspected intracranial metastatic disease from lung cancer. MRI brain without and with contrast recommended for further evaluation. Findings discussed with ordering provider team. Suspected osseous disease to the calvarium and skull base. Electronically Signed   By: Staci Righter M.D.   On: 07/30/2016 15:25   Mr Jeri Cos IR Contrast  Result Date: 07/31/2016 CLINICAL DATA:  Metastatic small cell lung cancer. Acute presentation with dysarthria and confusion. EXAM: MRI HEAD WITHOUT AND WITH CONTRAST TECHNIQUE: Multiplanar, multiecho pulse sequences of the brain and surrounding structures were obtained without and with intravenous contrast. CONTRAST:  56m  MULTIHANCE GADOBENATE DIMEGLUMINE 529 MG/ML IV SOLN COMPARISON:  07/30/2016 CT FINDINGS: Brain: There are multiple supratentorial metastases described below. The brainstem and cerebellum are normal. No evidence of recent ischemic infarction. Mild chronic small-vessel ischemic change of the hemispheric white matter. No hydrocephalus. No extra-axial collection. 2 mm of left-to-right shift because of slightly more prominent edema on the left. Necrotic brain metastases are more conspicuous on diffusion imaging. Because of the necrotic nature, there is thin peripheral enhancement. Lesions are associated with vasogenic edema, slightly more on the left than the right with 2 mm of left-to-right shift. Small amount of blood products associated with the left parietal and right frontal lesions, but  no pronounced hemorrhage. Individual lesions as follows. Left thalamus:  21 x 14 mm. Right occipital lobe:  13 x 16 mm. Right frontal lobe, 23 x 25 mm. Right medial parietal lobe, 19 x 14 mm. Right posterior parietal lobe:  6 x 10 mm. Right posterior frontal vertex: 9 mm in diameter. Left temporal/ occipital/ parietal junction:  20 x 16 mm. Left frontal lobe:  22 x 15 mm. Left parietal lobe:  9 mm in diameter. Vascular: Major vessels at the base of the brain show flow. Skull and upper cervical spine: Negative Sinuses/Orbits: Clear/ normal Other: None significant IMPRESSION: Nine supratentorial brain metastases with prominent necrosis and thin peripheral enhancement. Vasogenic edema. Left-to-right midline shift of 2 mm. Few hemorrhagic blood products associated with the left parietal and right frontal lesions. No pronounced hemorrhage. Electronically Signed   By: Nelson Chimes M.D.   On: 07/31/2016 10:38       IMPRESSION/PLAN: 1. Extensive stage small cell carcinoma of the right lung with newly diagnosed brain metastases.  Today, I talked to the patient and family about the findings and workup thus far. We discussed the natural  history of extensive stage small cell carcinoma with brain metastases and general treatment, highlighting the role of whole brain radiotherapy in the management. We discussed the available radiation techniques, and focused on the details of logistics and delivery. We reviewed the anticipated acute and late sequelae associated with whole brain radiation in this setting. The patient and family were encouraged to ask questions that were answered to their satisfaction.  The patient wishes to proceed with whole brain radiation for management of her disease.  We have scheduled her for CT Simulation today at 2pm with plans to begin treatment as early as Monday, May 14th.  The above documentation reflects my direct findings during this shared patient visit. Please see the separate note by Dr. Lisbeth Renshaw on this date for the remainder of the patient's plan of care.    Nicholos Johns, PA-C

## 2016-08-01 DIAGNOSIS — R1319 Other dysphagia: Secondary | ICD-10-CM

## 2016-08-01 DIAGNOSIS — R1310 Dysphagia, unspecified: Secondary | ICD-10-CM

## 2016-08-01 MED ORDER — NICOTINE 14 MG/24HR TD PT24
14.0000 mg | MEDICATED_PATCH | Freq: Every day | TRANSDERMAL | Status: DC
  Administered 2016-08-01 – 2016-08-03 (×3): 14 mg via TRANSDERMAL
  Filled 2016-08-01 (×3): qty 1

## 2016-08-01 NOTE — Progress Notes (Signed)
PROGRESS NOTE  Ashley Rowe IEP:[id number] DOB: 11-21-1940 DOA: 07/30/2016 PCP: No primary care provider on file.   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  On the morning of 07/30/2016, the patient appeared to be somewhat confused according to the patient's daughter. The patient's daughter returned from work at noon to check up on the patient and noted increasing confusion. As result, EMS was activated. The patient was noted to have dysarthria. CT of the brain showed widespread areas of vasogenic edema with suspected metastasis from her lung cancer. There was 2-3 mm left-to-right shift. The patient was started on intravenous dexamethasone. Medical oncology was consulted to assist.  Regarding her oncologic history, the patient was diagnosed with small cell lung cancer on 12/17/2015 via a right upper lobe core biopsy and transbronchial aspirate. She is status post 6 cycles of carboplatin and etoposide, completed 04/16/2016. Repeat staging CT chest and abdomen and pelvis on 05/27/2016 showed small to moderate right pleural effusion slightly increased with nodular areas in the right upper lobe similar to previous study. There was multiple sclerotic lesions throughout the axial and appendicular skeleton similar to prior examination. No new sites of metastatic disease noted.  Assessment/Plan: Dysphasia and dysarthria -likely due to brain mets with vasogenic edema with midline shift -remains dysphasic and dysarthric -continue dexamethasone IV -MRI brain--9 supratentorial metastasis with prominent necrosis and thin peripheral enhancement. There is vasogenic edema causing left-to-right shift 2 mm -speech therapy eval--> regular diet with thin liquids  Dysphagia--> recurrent vomiting -The patient complains of difficulty swallowing water and solid food -She has been  vomiting liquids and coffee -Barium esophagram  Acute encephalopathy -due to metastasis with vasogenic brain edema -showing some improvement with IV dexamethasone -I have consulted radiation oncology already this am -continue IV dexamethasone--> mental status back to baseline 08/01/2016 -Urinalysis negative for pyuria -Personally reviewed EKG--sinus rhythm, no ST-T wave changes  Vasogenic brain edema -due to metastatic disease -spoke with patient and daughter whom are agreeable to stay at [facility] for XRT  Right-sided pleural effusion -Oxygen saturation 98-99 percent room air -Represents malignant effusion  Extensive stage small cell lung cancer -status post 6 cycles of carboplatin and etoposide, completed 04/16/2016.  -follows Dr. Jacqulyn Ducking in [place]  Cancer related pain -continue home dose hydrocodone and fentanyl TD -continue Lyrica  COPD -continues to smoke -continue Dulera -stable on RA  Depression/Anxiety -continue buspirone and duloxetine   Disposition Plan:   Home 08/03/16 Family Communication:   Daughter updated 5/12--Total time spent 35 minutes.  Greater than 50% spent face to face counseling and coordinating care.   Consultants:MedOnc--Magrinat; RadOnc    Code Status:  FULL  DVT Prophylaxis:  SCDs   Procedures: As Listed in Progress Note Above  Antibiotics: None    Subjective: Patient complains of dysphagia for the past 2 weeks with difficulty swallowing liquids and solids. Denies any headache, chest pain, shortness of breath, hemoptysis, abdominal pain, dysuria and hematuria.  Objective: Vitals:   07/31/16 0454 07/31/16 2031 08/01/16 0504 08/01/16 1348  BP: (!) 146/60 (!) 150/56 133/60 (!) 154/58  Pulse: 63 71 68 61  Resp: '16 16 18 16  '$ Temp: 97.5 F (36.4 C) 98.2 F (36.8 C) 98.4 F (36.9 C) 98 F (36.7 C)  TempSrc: Oral Oral Oral Oral  SpO2: 95% 94% 98% 96%  Weight:  Height:        Intake/Output Summary  (Last 24 hours) at 08/01/16 1757 Last data filed at 08/01/16 1616  Gross per 24 hour  Intake                0 ml  Output                1 ml  Net               -1 ml   Weight change:  Exam:   General:  Pt is alert, follows commands appropriately, not in acute distress  HEENT: No icterus, No thrush, No neck mass, [place]/AT  Cardiovascular: RRR, S1/S2, no rubs, no gallops  Respiratory: Bibasilar crackles. No wheezing. Good air movement  Abdomen: Soft/+BS, non tender, non distended, no guarding  Extremities: No edema, No lymphangitis, No petechiae, No rashes, no synovitis   Data Reviewed: I have personally reviewed following labs and imaging studies Basic Metabolic Panel:  Recent Labs Lab 07/30/16 1459  NA 138  K 4.1  CL 101  CO2 27  GLUCOSE 148*  BUN 19  CREATININE 0.84  CALCIUM 9.1   Liver Function Tests:  Recent Labs Lab 07/30/16 1459  AST 19  ALT 11*  ALKPHOS 54  BILITOT 0.3  PROT 7.1  ALBUMIN 3.7   No results for input(s): LIPASE, AMYLASE in the last 168 hours. No results for input(s): AMMONIA in the last 168 hours. Coagulation Profile:  Recent Labs Lab 07/30/16 1459  INR 0.97   CBC:  Recent Labs Lab 07/30/16 1459  WBC 10.3  NEUTROABS 6.8  HGB 11.6*  HCT 35.8*  MCV 96.0  PLT 224   Cardiac Enzymes:  Recent Labs Lab 07/30/16 1459  TROPONINI <0.03   BNP: Invalid input(s): POCBNP CBG:  Recent Labs Lab 07/30/16 1457  GLUCAP 139*   HbA1C: No results for input(s): HGBA1C in the last 72 hours. Urine analysis:    Component Value Date/Time   COLORURINE YELLOW 07/30/2016 2151   APPEARANCEUR HAZY (A) 07/30/2016 2151   LABSPEC 1.016 07/30/2016 2151   PHURINE 5.0 07/30/2016 2151   GLUCOSEU NEGATIVE 07/30/2016 2151   HGBUR NEGATIVE 07/30/2016 2151   BILIRUBINUR NEGATIVE 07/30/2016 2151   KETONESUR 5 (A) 07/30/2016 2151   PROTEINUR NEGATIVE 07/30/2016 2151   NITRITE NEGATIVE 07/30/2016 2151   LEUKOCYTESUR NEGATIVE 07/30/2016 2151     Sepsis Labs: '@LABRCNTIP'$ (procalcitonin:4,lacticidven:4) )No results found for this or any previous visit (from the past 240 hour(s)).   Scheduled Meds: . dexamethasone  10 mg Intravenous Q6H  . DULoxetine  60 mg Oral Daily  . fentaNYL  25 mcg Transdermal Q72H  . mouth rinse  15 mL Mouth Rinse BID  . montelukast  10 mg Oral Daily  . nicotine  14 mg Transdermal Daily  . pregabalin  75 mg Oral BID  . sodium chloride flush  3 mL Intravenous Q12H   Continuous Infusions: . sodium chloride      Procedures/Studies: Ct Head Wo Contrast  Result Date: 07/30/2016 CLINICAL DATA:  Slurred speech with unsteady gait.  Lung cancer. EXAM: CT HEAD WITHOUT CONTRAST TECHNIQUE: Contiguous axial images were obtained from the base of the skull through the vertex without intravenous contrast. COMPARISON:  None. FINDINGS: Brain: Widespread areas of vasogenic edema are seen throughout both cerebral hemispheres, or extensive on the LEFT. There are suspected underlying isodense masses, representing metastatic disease the brain. There may be early edema in the RIGHT cerebellum. Early LEFT-to-RIGHT shift supratentorial midline, estimated  2-3 mm. No hemorrhage. Small calcification LEFT middle cerebellar peduncle, uncertain significance. Chronic lacunar infarct LEFT thalamus. No incipient herniation. Vascular: No hyperdense vessel or unexpected calcification. Skull: Permeative change in the diploic space of of the calvarium, concerning for metastatic disease. Permeative change also appears to involve clivus. Sinuses/Orbits: No layering fluid orbital mass. Other: None. IMPRESSION: Widespread areas of vasogenic edema on this noncontrast exam, suspected intracranial metastatic disease from lung cancer. MRI brain without and with contrast recommended for further evaluation. Findings discussed with ordering provider team. Suspected osseous disease to the calvarium and skull base. Electronically Signed   By: Staci Righter M.D.    On: 07/30/2016 15:25   Mr Jeri Cos MV Contrast  Result Date: 07/31/2016 CLINICAL DATA:  Metastatic small cell lung cancer. Acute presentation with dysarthria and confusion. EXAM: MRI HEAD WITHOUT AND WITH CONTRAST TECHNIQUE: Multiplanar, multiecho pulse sequences of the brain and surrounding structures were obtained without and with intravenous contrast. CONTRAST:  29m MULTIHANCE GADOBENATE DIMEGLUMINE 529 MG/ML IV SOLN COMPARISON:  07/30/2016 CT FINDINGS: Brain: There are multiple supratentorial metastases described below. The brainstem and cerebellum are normal. No evidence of recent ischemic infarction. Mild chronic small-vessel ischemic change of the hemispheric white matter. No hydrocephalus. No extra-axial collection. 2 mm of left-to-right shift because of slightly more prominent edema on the left. Necrotic brain metastases are more conspicuous on diffusion imaging. Because of the necrotic nature, there is thin peripheral enhancement. Lesions are associated with vasogenic edema, slightly more on the left than the right with 2 mm of left-to-right shift. Small amount of blood products associated with the left parietal and right frontal lesions, but no pronounced hemorrhage. Individual lesions as follows. Left thalamus:  21 x 14 mm. Right occipital lobe:  13 x 16 mm. Right frontal lobe, 23 x 25 mm. Right medial parietal lobe, 19 x 14 mm. Right posterior parietal lobe:  6 x 10 mm. Right posterior frontal vertex: 9 mm in diameter. Left temporal/ occipital/ parietal junction:  20 x 16 mm. Left frontal lobe:  22 x 15 mm. Left parietal lobe:  9 mm in diameter. Vascular: Major vessels at the base of the brain show flow. Skull and upper cervical spine: Negative Sinuses/Orbits: Clear/ normal Other: None significant IMPRESSION: Nine supratentorial brain metastases with prominent necrosis and thin peripheral enhancement. Vasogenic edema. Left-to-right midline shift of 2 mm. Few hemorrhagic blood products associated  with the left parietal and right frontal lesions. No pronounced hemorrhage. Electronically Signed   By: MNelson ChimesM.D.   On: 07/31/2016 10:38   Dg Swallowing Func-speech Pathology  Result Date: 07/31/2016 Objective Swallowing Evaluation: Type of Study: MBS-Modified Barium Swallow Study Patient Details Name: Ashley DicolaMRN: [id number] of Birth: 3September 18, 1942Today's Date: 07/31/2016 Time: SLP Start Time (ACUTE ONLY): 1210-SLP Stop Time (ACUTE ONLY): 1227 SLP Time Calculation (min) (ACUTE ONLY): 17 min Past Medical History: Past Medical History: Diagnosis Date . Lung cancer ([facility]  Past Surgical History: Past Surgical History: Procedure Laterality Date . BACK SURGERY   HPI: ACheri Ayottea 76y.o.femalewith medical history significant [facility]  She thinks finished treatment in January 2017.  She comes in with trouble speaking since yesterday. She denies any other neurological symptoms. Not drooling. No numbness/tingling anywhere. No headache or blurred vision. [facility] areas of vasogenic edema, suspected intracranial metastic disease from known lung cancer.  Pt therefore transferred here for further treatment and evaluation.  Nursing reported that the patient has crackles at the left lung base that are new.  Patient c/o issues swallwoing with material sticking.  She is vague as to whether symtoms are worse with food or liquids.   Subjective: The patient was seen sitting upright in bed for swallowing evaluation.  Assessment / Plan / Recommendation CHL IP CLINICAL IMPRESSIONS 07/31/2016 Clinical Impression Pt exhibited mild-moderate oral dysphagia marked by decreased cohesion with premature sublingal and vallecular spill, oral holding, delayed transit and mild lingual residue. Pharyngeal phase remarkable for min-mild intermittent vallecular residue clearing with spontaneous second swallows. Flash penetration x 1 with first trial  of thin due to incomplete laryngeal closure. Barium pill appeared to hesitate briefly upper-mid esophagus before descending without assist. Suspect upper pharyngeal globus sensation may be due to esophageal component. Recommend pt continue regular texture, thin liquids, pills with water or if difficulty whole in puree.   SLP Visit Diagnosis Dysphagia, oropharyngeal phase (R13.12) Attention and concentration deficit following -- Frontal lobe and executive function deficit following -- Impact on safety and function (No Data)   CHL IP TREATMENT RECOMMENDATION 07/31/2016 Treatment Recommendations Therapy as outlined in treatment plan below   Prognosis 07/31/2016 Prognosis for Safe Diet Advancement Fair Barriers to Reach Goals -- Barriers/Prognosis Comment -- CHL IP DIET RECOMMENDATION 07/31/2016 SLP Diet Recommendations Regular solids;Thin liquid Liquid Administration via Cup;Straw Medication Administration Whole meds with liquid Compensations Slow rate;Small sips/bites Postural Changes Seated upright at 90 degrees   CHL IP OTHER RECOMMENDATIONS 07/31/2016 Recommended Consults -- Oral Care Recommendations Oral care BID Other Recommendations --   CHL IP FOLLOW UP RECOMMENDATIONS 07/31/2016 Follow up Recommendations (No Data)   CHL IP FREQUENCY AND DURATION 07/31/2016 Speech Therapy Frequency (ACUTE ONLY) min 2x/week Treatment Duration 2 weeks      CHL IP ORAL PHASE 07/31/2016 Oral Phase Impaired Oral - Pudding Teaspoon -- Oral - Pudding Cup -- Oral - Honey Teaspoon -- Oral - Honey Cup -- Oral - Nectar Teaspoon -- Oral - Nectar Cup -- Oral - Nectar Straw -- Oral - Thin Teaspoon -- Oral - Thin Cup Right anterior bolus loss;Holding of bolus;Premature spillage;Decreased bolus cohesion;Delayed oral transit;Lingual/palatal residue Oral - Thin Straw Right anterior bolus loss;Holding of bolus;Premature spillage;Decreased bolus cohesion;Delayed oral transit;Lingual/palatal residue Oral - Puree -- Oral - Mech Soft -- Oral - Regular NT  Oral - Multi-Consistency -- Oral - Pill Delayed oral transit;Decreased bolus cohesion Oral Phase - Comment --  CHL IP PHARYNGEAL PHASE 07/31/2016 Pharyngeal Phase Impaired Pharyngeal- Pudding Teaspoon -- Pharyngeal -- Pharyngeal- Pudding Cup -- Pharyngeal -- Pharyngeal- Honey Teaspoon -- Pharyngeal -- Pharyngeal- Honey Cup -- Pharyngeal -- Pharyngeal- Nectar Teaspoon -- Pharyngeal -- Pharyngeal- Nectar Cup -- Pharyngeal -- Pharyngeal- Nectar Straw -- Pharyngeal -- Pharyngeal- Thin Teaspoon -- Pharyngeal -- Pharyngeal- Thin Cup Penetration/Aspiration during swallow;Pharyngeal residue - valleculae;Pharyngeal residue - pyriform;Reduced laryngeal elevation Pharyngeal Material enters airway, remains ABOVE vocal cords then ejected out Pharyngeal- Thin Straw -- Pharyngeal -- Pharyngeal- Puree -- Pharyngeal -- Pharyngeal- Mechanical Soft -- Pharyngeal -- Pharyngeal- Regular WFL Pharyngeal -- Pharyngeal- Multi-consistency -- Pharyngeal -- Pharyngeal- Pill WFL Pharyngeal -- Pharyngeal Comment --  CHL IP CERVICAL ESOPHAGEAL PHASE 07/31/2016 Cervical Esophageal Phase WFL Pudding Teaspoon -- Pudding Cup -- Honey Teaspoon -- Honey Cup -- Nectar Teaspoon -- Nectar Cup -- Nectar Straw -- Thin Teaspoon -- Thin Cup -- Thin Straw -- Puree -- Mechanical Soft -- Regular -- Multi-consistency -- Pill -- Cervical Esophageal Comment -- No flowsheet data found. Houston Siren 07/31/2016, 5:08 PM  Orbie Pyo Loyola.Ed CCC-SLP Pager [phone]              Shandi Godfrey, DO  [facility] Pager [phone]  If 7PM-7AM, please contact night-coverage [email or web address] Password [email or web address] 08/01/2016, 5:57 PM   LOS: 2 days

## 2016-08-01 NOTE — Evaluation (Signed)
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 CT of the brain showed widespread areas of vasogenic edema with suspected metastasis from her lung cancer.   Clinical Impression  Patient evaluated by Physical Therapy with no further acute PT needs identified. All education has been completed and the patient has no further questions.  Pt ambulated entire east unit without LOB/unsteadiness.  Pt states she lives with her daughter, and they are planning to move to apt on main level.  Pt encouraged to ambulate with staff during acute stay.  See below for any follow-up Physical Therapy or equipment needs. PT is signing off. Thank you for this referral.     Follow Up Recommendations Outpatient PT;Supervision - Intermittent    Equipment Recommendations  None recommended by PT    Recommendations for Other Services       Precautions / Restrictions Precautions Precautions: Fall      Mobility  Bed Mobility Overal bed mobility: Modified Independent                Transfers Overall transfer level: Needs assistance Equipment used: None Transfers: Sit to/from Stand Sit to Stand: Supervision         General transfer comment: supervision for safety  Ambulation/Gait Ambulation/Gait assistance: Supervision;Min guard Ambulation Distance (Feet): 400 Feet Assistive device: None Gait Pattern/deviations: Step-through pattern     General Gait Details: increased trunk lateral sway and stiff lower body observed however pt reports feeling at baseline, no LOB or unsteadiness observed  Stairs            Wheelchair Mobility    Modified Rankin (Stroke Patients Only)       Balance                               High  Level Balance Comments: not formally assessed however pt reports no falls other then related to "low blood levels" (states she required transfusion)             Pertinent Vitals/Pain Pain Assessment: No/denies pain    Home Living Family/patient expects to be discharged to:: Private residence Living Arrangements: Children   Type of Home: Apartment Home Access: Stairs to enter   Technical brewer of Steps: flight Home Layout: One level Home Equipment: None Additional Comments: pt states her daughter "pushes" her up stairs to enter apt however they are moving to main level     Prior Function Level of Independence: Independent               Hand Dominance        Extremity/Trunk Assessment        Lower Extremity Assessment Lower Extremity Assessment: Generalized weakness    Cervical / Trunk Assessment Cervical / Trunk Assessment: Normal  Communication   Communication: Other (comment) (dysarthria)  Cognition Arousal/Alertness: Awake/alert Behavior During Therapy: WFL for tasks assessed/performed Overall Cognitive Status: Within Functional Limits for tasks assessed                                 General Comments: difficulty word finding      General Comments General comments (skin integrity, edema, etc.): Pt reports she has  cut down smoking to only 2 cigarettes/day from 1.5 packs.  She does not intend to quit.    Exercises     Assessment/Plan    PT Assessment All further PT needs can be met in the next venue of care  PT Problem List Decreased balance;Decreased strength       PT Treatment Interventions      PT Goals (Current goals can be found in the Care Plan section)  Acute Rehab PT Goals PT Goal Formulation: All assessment and education complete, DC therapy    Frequency     Barriers to discharge        Co-evaluation               AM-PAC PT "6 Clicks" Daily Activity  Outcome Measure Difficulty turning over in bed  (including adjusting bedclothes, sheets and blankets)?: None Difficulty moving from lying on back to sitting on the side of the bed? : None Difficulty sitting down on and standing up from a chair with arms (e.g., wheelchair, bedside commode, etc,.)?: None Help needed moving to and from a bed to chair (including a wheelchair)?: None Help needed walking in hospital room?: A Little Help needed climbing 3-5 steps with a railing? : A Little 6 Click Score: 22    End of Session   Activity Tolerance: Patient tolerated treatment well Patient left: in chair;with call bell/phone within reach;with chair alarm set Nurse Communication: Mobility status PT Visit Diagnosis: Other abnormalities of gait and mobility (R26.89)    Time: 1028-1040 PT Time Calculation (min) (ACUTE ONLY): 12 min   Charges:   PT Evaluation $PT Eval Low Complexity: 1 Procedure     PT G CodesCarmelia Bake, PT, DPT 08/01/2016 Pager: [phone]   York Ram E 08/01/2016, 12:25 PM

## 2016-08-02 DIAGNOSIS — R4781 Slurred speech: Secondary | ICD-10-CM

## 2016-08-02 DIAGNOSIS — R4789 Other speech disturbances: Secondary | ICD-10-CM

## 2016-08-02 DIAGNOSIS — C7931 Secondary malignant neoplasm of brain: Secondary | ICD-10-CM

## 2016-08-02 LAB — CBC
HEMATOCRIT: 35.5 % — AB (ref 36.0–46.0)
HEMOGLOBIN: 11.3 g/dL — AB (ref 12.0–15.0)
MCH: 29.7 pg (ref 26.0–34.0)
MCHC: 31.8 g/dL (ref 30.0–36.0)
MCV: 93.4 fL (ref 78.0–100.0)
Platelets: 230 10*3/uL (ref 150–400)
RBC: 3.8 MIL/uL — AB (ref 3.87–5.11)
RDW: 13.6 % (ref 11.5–15.5)
WBC: 22.4 10*3/uL — ABNORMAL HIGH (ref 4.0–10.5)

## 2016-08-02 LAB — BASIC METABOLIC PANEL
ANION GAP: 9 (ref 5–15)
BUN: 32 mg/dL — ABNORMAL HIGH (ref 6–20)
CHLORIDE: 102 mmol/L (ref 101–111)
CO2: 27 mmol/L (ref 22–32)
Calcium: 8.9 mg/dL (ref 8.9–10.3)
Creatinine, Ser: 0.94 mg/dL (ref 0.44–1.00)
GFR calc Af Amer: >60 mL/min (ref >60)
GFR calc non Af Amer: 57 mL/min — ABNORMAL LOW (ref >60)
Glucose, Bld: 190 mg/dL — ABNORMAL HIGH (ref 65–99)
Potassium: 4.4 mmol/L (ref 3.5–5.1)
Sodium: 138 mmol/L (ref 135–145)

## 2016-08-02 MED ORDER — DEXAMETHASONE SODIUM PHOSPHATE 4 MG/ML IJ SOLN
4.0000 mg | Freq: Three times a day (TID) | INTRAMUSCULAR | Status: DC
  Administered 2016-08-03: 4 mg via INTRAVENOUS
  Filled 2016-08-02: qty 1

## 2016-08-02 MED ORDER — AMLODIPINE BESYLATE 5 MG PO TABS
5.0000 mg | ORAL_TABLET | Freq: Every day | ORAL | Status: DC
  Administered 2016-08-02 – 2016-08-03 (×2): 5 mg via ORAL
  Filled 2016-08-02 (×2): qty 1

## 2016-08-02 NOTE — Progress Notes (Signed)
Per previous RN, MD approved that pt could go outside in wheelchair if taken by family. Pt had done so during the day shift, and asked if she could go again during the night shift. After patient returned to floor she confessed that she "took a few hits from a cigarette." Pt educated on the importance of not smoking, and was reminded that she had a nicotine patch on. Pt told she would not be able to go back outside without a nurses supervision. Will continue to monitor.

## 2016-08-02 NOTE — Progress Notes (Signed)
PROGRESS NOTE  Ashley Rowe KAJ:[id number] DOB: Oct 14, 1940 DOA: 07/30/2016 PCP: No primary care provider on file.   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 On the morning of 07/30/2016, the patient appeared to be somewhat confused according to the patient's daughter. The patient's daughter returned from work at noon to check up on the patient and noted increasing confusion. As result, EMS was activated. The patient was noted to have dysarthria. CT of the brain showed widespread areas of vasogenic edema with suspected metastasis from her lung cancer. There was 2-3 mm left-to-right shift. The patient was started on intravenous dexamethasone. Medical oncology was consulted to assist.  Regarding her oncologic history, the patient was diagnosed with small cell lung cancer on 12/17/2015 via a right upper lobe core biopsy and transbronchial aspirate. She is status post 6 cycles of carboplatin and etoposide, completed 04/16/2016. Repeat staging CT chest and abdomen and pelvis on 05/27/2016 showed small to moderate right pleural effusion slightly increased with nodular areas in the right upper lobe similar to previous study. There was multiple sclerotic lesions throughout the axial and appendicular skeleton similar to prior examination. No new sites of metastatic disease noted.  Assessment/Plan: Dysphasia and dysarthria -likely due to brain mets with vasogenic edema with midline shift -remains dysphasic and dysarthric-->slowly improving -continue dexamethasone IV-->wean to 4 mg IV q 8 hours -MRI brain--9 supratentorial metastasis with prominent necrosis and thin peripheral enhancement. There is vasogenic edema causing left-to-right shift 2 mm -speech therapy eval--> regular diet with thin liquids  Dysphagia--> recurrent vomiting -The patient complains of difficulty  swallowing water and solid food -She has been vomiting liquids and coffee -Barium esophagram 5/14  Acute encephalopathy -due to metastasis with vasogenic brain edema -showing some improvement with IV dexamethasone -I have consulted radiation oncology already this am -continue IV dexamethasone--> mental status back to baseline 08/01/2016 -Urinalysis negative for pyuria -Personally reviewed EKG--sinus rhythm, no ST-T wave changes  Vasogenic brain edema -due to metastatic disease -spoke with patient and daughter whom are agreeable to stay at [facility] for XRT  Right-sided pleural effusion -Oxygen saturation 96-99 percent room air -Represents malignant effusion  Extensive stage small cell lung cancer -status post 6 cycles of carboplatin and etoposide, completed 04/16/2016.  -follows Dr. Jacqulyn Ducking in [place]  Cancer related pain -continue home dose hydrocodone and fentanyl TD -continue Lyrica  COPD -continues to smoke -continue Dulera -stable on RA  Depression/Anxiety -continue buspirone and duloxetine  Hyperglycemia -Secondary to steroids -Check hemoglobin A1c  Elevated blood pressure -Likely secondary to steroids -No history of hypertension -Start amlodipine   Disposition Plan: Home 08/03/16 if esophagram unremarkable Family Communication: Daughter updated 5/12--Total time spent 35 minutes. Greater than 50% spent face to face counseling and coordinating care.   Consultants:MedOnc--Magrinat; RadOnc  Code Status: FULL  DVT Prophylaxis: SCDs   Procedures: As Listed in Progress Note Above  Antibiotics: None   Subjective: Patient denies fevers, chills, headache, chest pain, dyspnea, nausea, vomiting, diarrhea, abdominal pain, dysuria, hematuria, hematochezia, and melena. The patient did not have any vomiting since she was drinking and eating slower. She feels that her speech dysarthria and dysphasia or  improving.   Objective: Vitals:   08/01/16 2129 08/02/16 0004 08/02/16 0413 08/02/16 1323  BP: (!) 178/60 (!) 168/71 (!) 153/59 (!) 185/66  Pulse: 65  (!) 56 (!) 59  Resp: '18  20 20  '$ Temp: 97.5 F (36.4 C)  97.9 F (36.6 C) 97.6 F (36.4 C)  TempSrc: Oral  Oral Oral  SpO2: 96%  95% 95%  Weight:      Height:        Intake/Output Summary (Last 24 hours) at 08/02/16 1654 Last data filed at 08/01/16 2200  Gross per 24 hour  Intake              120 ml  Output                0 ml  Net              120 ml   Weight change:  Exam:   General:  Pt is alert, follows commands appropriately, not in acute distress  HEENT: No icterus, No thrush, No neck mass, [place]/AT  Cardiovascular: RRR, S1/S2, no rubs, no gallops  Respiratory: CTA bilaterally, no wheezing, no crackles, no rhonchi  Abdomen: Soft/+BS, non tender, non distended, no guarding  Extremities: No edema, No lymphangitis, No petechiae, No rashes, no synovitis   Data Reviewed: I have personally reviewed following labs and imaging studies Basic Metabolic Panel:  Recent Labs Lab 07/30/16 1459 08/02/16 0525  NA 138 138  K 4.1 4.4  CL 101 102  CO2 27 27  GLUCOSE 148* 190*  BUN 19 32*  CREATININE 0.84 0.94  CALCIUM 9.1 8.9   Liver Function Tests:  Recent Labs Lab 07/30/16 1459  AST 19  ALT 11*  ALKPHOS 54  BILITOT 0.3  PROT 7.1  ALBUMIN 3.7   No results for input(s): LIPASE, AMYLASE in the last 168 hours. No results for input(s): AMMONIA in the last 168 hours. Coagulation Profile:  Recent Labs Lab 07/30/16 1459  INR 0.97   CBC:  Recent Labs Lab 07/30/16 1459 08/02/16 0525  WBC 10.3 22.4*  NEUTROABS 6.8  --   HGB 11.6* 11.3*  HCT 35.8* 35.5*  MCV 96.0 93.4  PLT 224 230   Cardiac Enzymes:  Recent Labs Lab 07/30/16 1459  TROPONINI <0.03   BNP: Invalid input(s): POCBNP CBG:  Recent Labs Lab 07/30/16 1457  GLUCAP 139*   HbA1C: No results for input(s): HGBA1C in the last 72  hours. Urine analysis:    Component Value Date/Time   COLORURINE YELLOW 07/30/2016 2151   APPEARANCEUR HAZY (A) 07/30/2016 2151   LABSPEC 1.016 07/30/2016 2151   PHURINE 5.0 07/30/2016 2151   GLUCOSEU NEGATIVE 07/30/2016 2151   HGBUR NEGATIVE 07/30/2016 2151   BILIRUBINUR NEGATIVE 07/30/2016 2151   KETONESUR 5 (A) 07/30/2016 2151   PROTEINUR NEGATIVE 07/30/2016 2151   NITRITE NEGATIVE 07/30/2016 2151   LEUKOCYTESUR NEGATIVE 07/30/2016 2151   Sepsis Labs: '@LABRCNTIP'$ (procalcitonin:4,lacticidven:4) )No results found for this or any previous visit (from the past 240 hour(s)).   Scheduled Meds: . amLODipine  5 mg Oral Daily  . dexamethasone  10 mg Intravenous Q6H  . DULoxetine  60 mg Oral Daily  . fentaNYL  25 mcg Transdermal Q72H  . mouth rinse  15 mL Mouth Rinse BID  . montelukast  10 mg Oral Daily  . nicotine  14 mg Transdermal Daily  . pregabalin  75 mg Oral BID  . sodium chloride flush  3 mL Intravenous Q12H   Continuous Infusions: . sodium chloride      Procedures/Studies: Ct Head Wo Contrast  Result Date: 07/30/2016 CLINICAL DATA:  Slurred speech with unsteady gait.  Lung cancer. EXAM: CT HEAD WITHOUT CONTRAST TECHNIQUE: Contiguous  axial images were obtained from the base of the skull through the vertex without intravenous contrast. COMPARISON:  None. FINDINGS: Brain: Widespread areas of vasogenic edema are seen throughout both cerebral hemispheres, or extensive on the LEFT. There are suspected underlying isodense masses, representing metastatic disease the brain. There may be early edema in the RIGHT cerebellum. Early LEFT-to-RIGHT shift supratentorial midline, estimated 2-3 mm. No hemorrhage. Small calcification LEFT middle cerebellar peduncle, uncertain significance. Chronic lacunar infarct LEFT thalamus. No incipient herniation. Vascular: No hyperdense vessel or unexpected calcification. Skull: Permeative change in the diploic space of of the calvarium, concerning for  metastatic disease. Permeative change also appears to involve clivus. Sinuses/Orbits: No layering fluid orbital mass. Other: None. IMPRESSION: Widespread areas of vasogenic edema on this noncontrast exam, suspected intracranial metastatic disease from lung cancer. MRI brain without and with contrast recommended for further evaluation. Findings discussed with ordering provider team. Suspected osseous disease to the calvarium and skull base. Electronically Signed   By: Staci Righter M.D.   On: 07/30/2016 15:25   Mr Jeri Cos OF Contrast  Result Date: 07/31/2016 CLINICAL DATA:  Metastatic small cell lung cancer. Acute presentation with dysarthria and confusion. EXAM: MRI HEAD WITHOUT AND WITH CONTRAST TECHNIQUE: Multiplanar, multiecho pulse sequences of the brain and surrounding structures were obtained without and with intravenous contrast. CONTRAST:  60m MULTIHANCE GADOBENATE DIMEGLUMINE 529 MG/ML IV SOLN COMPARISON:  07/30/2016 CT FINDINGS: Brain: There are multiple supratentorial metastases described below. The brainstem and cerebellum are normal. No evidence of recent ischemic infarction. Mild chronic small-vessel ischemic change of the hemispheric white matter. No hydrocephalus. No extra-axial collection. 2 mm of left-to-right shift because of slightly more prominent edema on the left. Necrotic brain metastases are more conspicuous on diffusion imaging. Because of the necrotic nature, there is thin peripheral enhancement. Lesions are associated with vasogenic edema, slightly more on the left than the right with 2 mm of left-to-right shift. Small amount of blood products associated with the left parietal and right frontal lesions, but no pronounced hemorrhage. Individual lesions as follows. Left thalamus:  21 x 14 mm. Right occipital lobe:  13 x 16 mm. Right frontal lobe, 23 x 25 mm. Right medial parietal lobe, 19 x 14 mm. Right posterior parietal lobe:  6 x 10 mm. Right posterior frontal vertex: 9 mm in  diameter. Left temporal/ occipital/ parietal junction:  20 x 16 mm. Left frontal lobe:  22 x 15 mm. Left parietal lobe:  9 mm in diameter. Vascular: Major vessels at the base of the brain show flow. Skull and upper cervical spine: Negative Sinuses/Orbits: Clear/ normal Other: None significant IMPRESSION: Nine supratentorial brain metastases with prominent necrosis and thin peripheral enhancement. Vasogenic edema. Left-to-right midline shift of 2 mm. Few hemorrhagic blood products associated with the left parietal and right frontal lesions. No pronounced hemorrhage. Electronically Signed   By: MNelson ChimesM.D.   On: 07/31/2016 10:38   Dg Swallowing Func-speech Pathology  Result Date: 07/31/2016 Objective Swallowing Evaluation: Type of Study: MBS-Modified Barium Swallow Study Patient Details Name: AChirsty ArmisteadMRN: [id number] of Birth: 303/22/1942Today's Date: 07/31/2016 Time: SLP Start Time (ACUTE ONLY): 1210-SLP Stop Time (ACUTE ONLY): 1227 SLP Time Calculation (min) (ACUTE ONLY): 17 min Past Medical History: Past Medical History: Diagnosis Date . Lung cancer ([facility]  Past Surgical History: Past Surgical History: Procedure Laterality Date . BACK SURGERY   HPI: ASorina Derriga 76y.o.femalewith medical history significant of [facility]  She thinks  finished treatment in January 2017.  She comes in with trouble speaking since yesterday. She denies any other neurological symptoms. Not drooling. No numbness/tingling anywhere. No headache or blurred vision. She went to [facility] and CT head shows widespread areas of vasogenic edema, suspected intracranial metastic disease from known lung cancer.  Pt therefore transferred here for further treatment and evaluation.   Nursing reported that the patient has crackles at the left lung base that are new.  Patient c/o issues swallwoing with material sticking.  She is vague as to whether symtoms are worse with food or liquids.   Subjective:  The patient was seen sitting upright in bed for swallowing evaluation.  Assessment / Plan / Recommendation CHL IP CLINICAL IMPRESSIONS 07/31/2016 Clinical Impression Pt exhibited mild-moderate oral dysphagia marked by decreased cohesion with premature sublingal and vallecular spill, oral holding, delayed transit and mild lingual residue. Pharyngeal phase remarkable for min-mild intermittent vallecular residue clearing with spontaneous second swallows. Flash penetration x 1 with first trial of thin due to incomplete laryngeal closure. Barium pill appeared to hesitate briefly upper-mid esophagus before descending without assist. Suspect upper pharyngeal globus sensation may be due to esophageal component. Recommend pt continue regular texture, thin liquids, pills with water or if difficulty whole in puree.   SLP Visit Diagnosis Dysphagia, oropharyngeal phase (R13.12) Attention and concentration deficit following -- Frontal lobe and executive function deficit following -- Impact on safety and function (No Data)   CHL IP TREATMENT RECOMMENDATION 07/31/2016 Treatment Recommendations Therapy as outlined in treatment plan below   Prognosis 07/31/2016 Prognosis for Safe Diet Advancement Fair Barriers to Reach Goals -- Barriers/Prognosis Comment -- CHL IP DIET RECOMMENDATION 07/31/2016 SLP Diet Recommendations Regular solids;Thin liquid Liquid Administration via Cup;Straw Medication Administration Whole meds with liquid Compensations Slow rate;Small sips/bites Postural Changes Seated upright at 90 degrees   CHL IP OTHER RECOMMENDATIONS 07/31/2016 Recommended Consults -- Oral Care Recommendations Oral care BID Other Recommendations --   CHL IP FOLLOW UP RECOMMENDATIONS 07/31/2016 Follow up Recommendations (No Data)   CHL IP FREQUENCY AND DURATION 07/31/2016 Speech Therapy Frequency (ACUTE ONLY) min 2x/week Treatment Duration 2 weeks      CHL IP ORAL PHASE 07/31/2016 Oral Phase Impaired Oral - Pudding Teaspoon -- Oral - Pudding Cup --  Oral - Honey Teaspoon -- Oral - Honey Cup -- Oral - Nectar Teaspoon -- Oral - Nectar Cup -- Oral - Nectar Straw -- Oral - Thin Teaspoon -- Oral - Thin Cup Right anterior bolus loss;Holding of bolus;Premature spillage;Decreased bolus cohesion;Delayed oral transit;Lingual/palatal residue Oral - Thin Straw Right anterior bolus loss;Holding of bolus;Premature spillage;Decreased bolus cohesion;Delayed oral transit;Lingual/palatal residue Oral - Puree -- Oral - Mech Soft -- Oral - Regular NT Oral - Multi-Consistency -- Oral - Pill Delayed oral transit;Decreased bolus cohesion Oral Phase - Comment --  CHL IP PHARYNGEAL PHASE 07/31/2016 Pharyngeal Phase Impaired Pharyngeal- Pudding Teaspoon -- Pharyngeal -- Pharyngeal- Pudding Cup -- Pharyngeal -- Pharyngeal- Honey Teaspoon -- Pharyngeal -- Pharyngeal- Honey Cup -- Pharyngeal -- Pharyngeal- Nectar Teaspoon -- Pharyngeal -- Pharyngeal- Nectar Cup -- Pharyngeal -- Pharyngeal- Nectar Straw -- Pharyngeal -- Pharyngeal- Thin Teaspoon -- Pharyngeal -- Pharyngeal- Thin Cup Penetration/Aspiration during swallow;Pharyngeal residue - valleculae;Pharyngeal residue - pyriform;Reduced laryngeal elevation Pharyngeal Material enters airway, remains ABOVE vocal cords then ejected out Pharyngeal- Thin Straw -- Pharyngeal -- Pharyngeal- Puree -- Pharyngeal -- Pharyngeal- Mechanical Soft -- Pharyngeal -- Pharyngeal- Regular WFL Pharyngeal -- Pharyngeal- Multi-consistency -- Pharyngeal -- Pharyngeal- Pill WFL Pharyngeal -- Pharyngeal Comment --  CHL IP  CERVICAL ESOPHAGEAL PHASE 07/31/2016 Cervical Esophageal Phase WFL Pudding Teaspoon -- Pudding Cup -- Honey Teaspoon -- Honey Cup -- Nectar Teaspoon -- Nectar Cup -- Nectar Straw -- Thin Teaspoon -- Thin Cup -- Thin Straw -- Puree -- Mechanical Soft -- Regular -- Multi-consistency -- Pill -- Cervical Esophageal Comment -- No flowsheet data found. Houston Siren 07/31/2016, 5:08 PM  Orbie Pyo Colvin Caroli.Ed CCC-SLP Pager [phone]               Vannak Montenegro, DO  [facility] Pager [phone]  If 7PM-7AM, please contact night-coverage [email or web address] Password [email or web address] 08/02/2016, 4:54 PM   LOS: 3 days

## 2016-08-03 ENCOUNTER — Ambulatory Visit
Admit: 2016-08-03 | Discharge: 2016-08-03 | Disposition: A | Discharge status: Home / Self Care | Payer: Medicare Other | Attending: Radiation Oncology | Admitting: Radiation Oncology

## 2016-08-03 ENCOUNTER — Inpatient Hospital Stay (HOSPITAL_COMMUNITY): Payer: Medicare Other

## 2016-08-03 DIAGNOSIS — R1310 Dysphagia, unspecified: Secondary | ICD-10-CM

## 2016-08-03 LAB — BASIC METABOLIC PANEL
Anion gap: 12 (ref 5–15)
BUN: 36 mg/dL — ABNORMAL HIGH (ref 6–20)
CHLORIDE: 98 mmol/L — AB (ref 101–111)
CO2: 27 mmol/L (ref 22–32)
Calcium: 8.9 mg/dL (ref 8.9–10.3)
Creatinine, Ser: 0.92 mg/dL (ref 0.44–1.00)
GFR calc non Af Amer: 59 mL/min — ABNORMAL LOW (ref >60)
Glucose, Bld: 189 mg/dL — ABNORMAL HIGH (ref 65–99)
Potassium: 4.2 mmol/L (ref 3.5–5.1)
SODIUM: 137 mmol/L (ref 135–145)

## 2016-08-03 MED ORDER — DEXAMETHASONE 4 MG PO TABS: 4.0000 mg | ORAL_TABLET | Freq: Three times a day (TID) | ORAL | Status: DC

## 2016-08-03 MED ORDER — DEXAMETHASONE 4 MG PO TABS: 4.0000 mg | ORAL_TABLET | Freq: Three times a day (TID) | ORAL | 0 refills | Status: AC

## 2016-08-03 MED ORDER — AMLODIPINE BESYLATE 5 MG PO TABS: 5.0000 mg | ORAL_TABLET | Freq: Every day | ORAL | 0 refills | Status: AC

## 2016-08-03 NOTE — Care Management Note (Signed)
Case Management Note  Patient Details  Name: Ashley Rowe MRN: [id number] Date of Birth: 1941/02/15  Subjective/Objective: PT recc otpt PT. MD notified of manual script for otpt PT w/dx.                    Action/Plan:d/c plan home.   Expected Discharge Date:  08/03/16               Expected Discharge Plan:  Home/Self Care  In-House Referral:     Discharge planning Services  CM Consult  Post Acute Care Choice:    Choice offered to:     DME Arranged:    DME Agency:     HH Arranged:    HH Agency:     Status of Service:  Completed, signed off  If discussed at [employer] of Stay Meetings, dates discussed:    Additional Comments:  Dessa Phi, RN 08/03/2016, 11:57 AM

## 2016-08-03 NOTE — Discharge Summary (Signed)
Physician Discharge Summary  Ashley Rowe ZHG:[id number] DOB: Dec 27, 1940 DOA: 07/30/2016  PCP: No primary care provider on file.  Admit date: 07/30/2016 Discharge date: 08/03/2016  Admitted From: Home Disposition:  Home   Recommendations for Outpatient Follow-up:  1. Follow up with PCP in 1-2 weeks 2. 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 On the morning of 07/30/2016, the patient appeared to be somewhat confused according to the patient's daughter. The patient's daughter returned from work at noon to check up on the patient and noted increasing confusion. As result, EMS was activated. The patient was noted to have dysarthria. CT of the brain showed widespread areas of vasogenic edema with suspected metastasis from her lung cancer. There was 2-3 mm left-to-right shift. The patient was started on intravenous dexamethasone. Medical oncology was consulted to assist.  Regarding her oncologic history, the patient was diagnosed with small cell lung cancer on 12/17/2015 via a right upper lobe core biopsy and transbronchial aspirate. She is status post 6 cycles of carboplatin and etoposide, completed 04/16/2016. Repeat staging CT chest and abdomen and pelvis on 05/27/2016 showed small to moderate right pleural effusion slightly increased with nodular areas in the right upper lobe similar to previous study. There was multiple sclerotic lesions throughout the axial and appendicular skeleton similar to prior examination. No new sites of metastatic disease noted.  Discharge Diagnoses:   Dysphasia and dysarthria -likely due to brain mets with vasogenic edema with midline shift -remains dysphasic and dysarthric-->slowly improving -continue  dexamethasone IV-->wean to 4 mg IV q 8 hours -MRI brain--9 supratentorial metastasis with prominent necrosis and thin peripheral enhancement. There is vasogenic edema causing left-to-right shift 2 mm -speech therapy eval-->regular diet with thin liquids  Dysphagia-->recurrent vomiting -The patient complains of difficulty swallowing water and solid food -She has been vomiting liquids and coffee -Barium esophagram 5/14--unremarkable -follow up GI if continues/worsens -pt states it improves with benzo--?esophageal spasm -improves when pt takes time to chew and swallows liquids slowly  Acute encephalopathy -due to metastasis with vasogenic brain edema -showing some improvement with IV dexamethasone -I have consulted radiation oncology already this am -continue IV dexamethasone-->mental status back to baseline 08/01/2016 -Urinalysis negative for pyuria -Personally reviewed EKG--sinus rhythm, no ST-T wave changes  Vasogenic brain edema -due to metastatic disease -spoke with patient and daughter whom are agreeable to stay at [facility] for XRT -radiation oncology consulted and pt had simulation and started XRT first treatment on 08/03/16 -continue XRT outpt  -Radiation Oncology will manage and wean dexamethasone  Right-sided pleural effusion -Oxygen saturation 96-99 percent room air -Represents malignant effusion  Extensive stage small cell lung cancer -status post 6 cycles of carboplatin and etoposide, completed 04/16/2016.  -follows Dr. Jacqulyn Ducking in [place]  Cancer related pain -continue home dose hydrocodone and fentanyl TD -continue Lyrica  COPD -continues to smoke -continue Dulera -stable on RA  Depression/Anxiety -continue buspirone and duloxetine  Hyperglycemia -Secondary to steroids -Check hemoglobin A1c -anticipate improvement with weaning steroids  Essential HTN -steroids contributing -SBP 180s during admission -Start amlodipine 5 mg  daily  Discharge Instructions  Discharge Instructions    Diet - low sodium heart healthy    Complete by:  As directed    Increase activity slowly    Complete by:  As  directed      Allergies as of 08/03/2016      Reactions   Ivp Dye [iodinated Diagnostic Agents]       Medication List    TAKE these medications   amLODipine 5 MG tablet Commonly known as:  NORVASC Take 1 tablet (5 mg total) by mouth daily. Start taking on:  08/04/2016   budesonide-formoterol 160-4.5 MCG/ACT inhaler Commonly known as:  SYMBICORT Inhale 2 puffs into the lungs 2 (two) times daily.   busPIRone 10 MG tablet Commonly known as:  BUSPAR Take 10 mg by mouth 2 (two) times daily as needed for anxiety.   dexamethasone 4 MG tablet Commonly known as:  DECADRON Take 1 tablet (4 mg total) by mouth every 8 (eight) hours.   DULoxetine 60 MG capsule Commonly known as:  CYMBALTA Take 60 mg by mouth daily.   fentaNYL 25 MCG/HR patch Commonly known as:  DURAGESIC - dosed mcg/hr Place 25 mcg onto the skin every 3 (three) days.   HYDROcodone-acetaminophen 7.5-325 MG tablet Commonly known as:  NORCO Take 1 tablet by mouth every 6 (six) hours as needed for moderate pain.   montelukast 10 MG tablet Commonly known as:  SINGULAIR Take 10 mg by mouth daily.   MULTIVITAMIN ADULTS PO Take by mouth.   pregabalin 75 MG capsule Commonly known as:  LYRICA Take 75 mg by mouth 2 (two) times daily.       Allergies  Allergen Reactions  . Ivp Dye [Iodinated Diagnostic Agents]     Consultations:  Med Onc--Magrinat  Rad Onc--John Moody   Procedures/Studies: Ct Head Wo Contrast  Result Date: 07/30/2016 CLINICAL DATA:  Slurred speech with unsteady gait.  Lung cancer. EXAM: CT HEAD WITHOUT CONTRAST TECHNIQUE: Contiguous axial images were obtained from the base of the skull through the vertex without intravenous contrast. COMPARISON:  None. FINDINGS: Brain: Widespread areas of vasogenic edema are seen  throughout both cerebral hemispheres, or extensive on the LEFT. There are suspected underlying isodense masses, representing metastatic disease the brain. There may be early edema in the RIGHT cerebellum. Early LEFT-to-RIGHT shift supratentorial midline, estimated 2-3 mm. No hemorrhage. Small calcification LEFT middle cerebellar peduncle, uncertain significance. Chronic lacunar infarct LEFT thalamus. No incipient herniation. Vascular: No hyperdense vessel or unexpected calcification. Skull: Permeative change in the diploic space of of the calvarium, concerning for metastatic disease. Permeative change also appears to involve clivus. Sinuses/Orbits: No layering fluid orbital mass. Other: None. IMPRESSION: Widespread areas of vasogenic edema on this noncontrast exam, suspected intracranial metastatic disease from lung cancer. MRI brain without and with contrast recommended for further evaluation. Findings discussed with ordering provider team. Suspected osseous disease to the calvarium and skull base. Electronically Signed   By: Staci Righter M.D.   On: 07/30/2016 15:25   Mr Jeri Cos BM Contrast  Result Date: 07/31/2016 CLINICAL DATA:  Metastatic small cell lung cancer. Acute presentation with dysarthria and confusion. EXAM: MRI HEAD WITHOUT AND WITH CONTRAST TECHNIQUE: Multiplanar, multiecho pulse sequences of the brain and surrounding structures were obtained without and with intravenous contrast. CONTRAST:  36m MULTIHANCE GADOBENATE DIMEGLUMINE 529 MG/ML IV SOLN COMPARISON:  07/30/2016 CT FINDINGS: Brain: There are multiple supratentorial metastases described below. The brainstem and cerebellum are normal. No evidence of recent ischemic infarction. Mild chronic small-vessel ischemic change of the hemispheric white matter. No hydrocephalus. No extra-axial collection. 2 mm of left-to-right shift because of slightly more prominent edema on the left. Necrotic brain metastases are more conspicuous on diffusion  imaging.  Because of the necrotic nature, there is thin peripheral enhancement. Lesions are associated with vasogenic edema, slightly more on the left than the right with 2 mm of left-to-right shift. Small amount of blood products associated with the left parietal and right frontal lesions, but no pronounced hemorrhage. Individual lesions as follows. Left thalamus:  21 x 14 mm. Right occipital lobe:  13 x 16 mm. Right frontal lobe, 23 x 25 mm. Right medial parietal lobe, 19 x 14 mm. Right posterior parietal lobe:  6 x 10 mm. Right posterior frontal vertex: 9 mm in diameter. Left temporal/ occipital/ parietal junction:  20 x 16 mm. Left frontal lobe:  22 x 15 mm. Left parietal lobe:  9 mm in diameter. Vascular: Major vessels at the base of the brain show flow. Skull and upper cervical spine: Negative Sinuses/Orbits: Clear/ normal Other: None significant IMPRESSION: Nine supratentorial brain metastases with prominent necrosis and thin peripheral enhancement. Vasogenic edema. Left-to-right midline shift of 2 mm. Few hemorrhagic blood products associated with the left parietal and right frontal lesions. No pronounced hemorrhage. Electronically Signed   By: Nelson Chimes M.D.   On: 07/31/2016 10:38   Dg Esophagus  Result Date: 08/03/2016 CLINICAL DATA:  Dysphagia EXAM: ESOPHOGRAM/BARIUM SWALLOW TECHNIQUE: Single contrast examination was performed using  thin barium. FLUOROSCOPY TIME:  Fluoroscopy Time:  1.0 minutes Radiation Exposure Index (if provided by the fluoroscopic device): 14.3 mGy Number of Acquired Spot Images: 0 COMPARISON:  None. FINDINGS: Fluoroscopic evaluation of swallowing shows normal peristalsis. No fixed stricture, fold thickening or mass. No reflux with the water siphon maneuver. The patient swallowed a 13 mm barium tablet which freely passed into the stomach. IMPRESSION: Unremarkable study. Electronically Signed   By: Rolm Baptise M.D.   On: 08/03/2016 10:36   Dg Swallowing Func-speech  Pathology  Result Date: 07/31/2016 Objective Swallowing Evaluation: Type of Study: MBS-Modified Barium Swallow Study Patient Details Name: Lujain Kraszewski MRN: [id number] Date of Birth: January 23, 1941 Today's Date: 07/31/2016 Time: SLP Start Time (ACUTE ONLY): 1210-SLP Stop Time (ACUTE ONLY): 1227 SLP Time Calculation (min) (ACUTE ONLY): 17 min Past Medical History: Past Medical History: Diagnosis Date . Lung cancer [facility])  Past Surgical History: Past Surgical History: Procedure Laterality Date . BACK SURGERY   HPI: Tejah Brekke a 76 y.o.femalewith medical history significant of lung cancer treated at [facility].  She thinks finished treatment in January 2017.  She comes in with trouble speaking since yesterday. She denies any other neurological symptoms. Not drooling. No numbness/tingling anywhere. No headache or blurred vision. She went to [facility] and CT head shows widespread areas of vasogenic edema, suspected intracranial metastic disease from known lung cancer.  Pt therefore transferred here for further treatment and evaluation.   Nursing reported that the patient has crackles at the left lung base that are new.  Patient c/o issues swallwoing with material sticking.  She is vague as to whether symtoms are worse with food or liquids.   Subjective: The patient was seen sitting upright in bed for swallowing evaluation.  Assessment / Plan / Recommendation CHL IP CLINICAL IMPRESSIONS 07/31/2016 Clinical Impression Pt exhibited mild-moderate oral dysphagia marked by decreased cohesion with premature sublingal and vallecular spill, oral holding, delayed transit and mild lingual residue. Pharyngeal phase remarkable for min-mild intermittent vallecular residue clearing with spontaneous second swallows. Flash penetration x 1 with first trial of thin due to incomplete laryngeal closure. Barium pill appeared to hesitate briefly upper-mid esophagus before descending without assist. Suspect upper pharyngeal globus  sensation may be due to esophageal component. Recommend pt continue regular texture, thin liquids, pills with water or if difficulty whole in puree.   SLP Visit Diagnosis Dysphagia, oropharyngeal phase (R13.12) Attention and concentration deficit following -- Frontal lobe and executive function deficit following -- Impact on safety and function (No Data)   CHL IP TREATMENT RECOMMENDATION 07/31/2016 Treatment Recommendations Therapy as outlined in treatment plan below   Prognosis 07/31/2016 Prognosis for Safe Diet Advancement Fair Barriers to Reach Goals -- Barriers/Prognosis Comment -- CHL IP DIET RECOMMENDATION 07/31/2016 SLP Diet Recommendations Regular solids;Thin liquid Liquid Administration via Cup;Straw Medication Administration Whole meds with liquid Compensations Slow rate;Small sips/bites Postural Changes Seated upright at 90 degrees   CHL IP OTHER RECOMMENDATIONS 07/31/2016 Recommended Consults -- Oral Care Recommendations Oral care BID Other Recommendations --   CHL IP FOLLOW UP RECOMMENDATIONS 07/31/2016 Follow up Recommendations (No Data)   CHL IP FREQUENCY AND DURATION 07/31/2016 Speech Therapy Frequency (ACUTE ONLY) min 2x/week Treatment Duration 2 weeks      CHL IP ORAL PHASE 07/31/2016 Oral Phase Impaired Oral - Pudding Teaspoon -- Oral - Pudding Cup -- Oral - Honey Teaspoon -- Oral - Honey Cup -- Oral - Nectar Teaspoon -- Oral - Nectar Cup -- Oral - Nectar Straw -- Oral - Thin Teaspoon -- Oral - Thin Cup Right anterior bolus loss;Holding of bolus;Premature spillage;Decreased bolus cohesion;Delayed oral transit;Lingual/palatal residue Oral - Thin Straw Right anterior bolus loss;Holding of bolus;Premature spillage;Decreased bolus cohesion;Delayed oral transit;Lingual/palatal residue Oral - Puree -- Oral - Mech Soft -- Oral - Regular NT Oral - Multi-Consistency -- Oral - Pill Delayed oral transit;Decreased bolus cohesion Oral Phase - Comment --  CHL IP PHARYNGEAL PHASE 07/31/2016 Pharyngeal Phase Impaired  Pharyngeal- Pudding Teaspoon -- Pharyngeal -- Pharyngeal- Pudding Cup -- Pharyngeal -- Pharyngeal- Honey Teaspoon -- Pharyngeal -- Pharyngeal- Honey Cup -- Pharyngeal -- Pharyngeal- Nectar Teaspoon -- Pharyngeal -- Pharyngeal- Nectar Cup -- Pharyngeal -- Pharyngeal- Nectar Straw -- Pharyngeal -- Pharyngeal- Thin Teaspoon -- Pharyngeal -- Pharyngeal- Thin Cup Penetration/Aspiration during swallow;Pharyngeal residue - valleculae;Pharyngeal residue - pyriform;Reduced laryngeal elevation Pharyngeal Material enters airway, remains ABOVE vocal cords then ejected out Pharyngeal- Thin Straw -- Pharyngeal -- Pharyngeal- Puree -- Pharyngeal -- Pharyngeal- Mechanical Soft -- Pharyngeal -- Pharyngeal- Regular WFL Pharyngeal -- Pharyngeal- Multi-consistency -- Pharyngeal -- Pharyngeal- Pill WFL Pharyngeal -- Pharyngeal Comment --  CHL IP CERVICAL ESOPHAGEAL PHASE 07/31/2016 Cervical Esophageal Phase WFL Pudding Teaspoon -- Pudding Cup -- Honey Teaspoon -- Honey Cup -- Nectar Teaspoon -- Nectar Cup -- Nectar Straw -- Thin Teaspoon -- Thin Cup -- Thin Straw -- Puree -- Mechanical Soft -- Regular -- Multi-consistency -- Pill -- Cervical Esophageal Comment -- No flowsheet data found. Houston Siren 07/31/2016, 5:08 PM  Orbie Pyo Colvin Caroli.Ed CCC-SLP Pager [phone]                  Discharge Exam: Vitals:   08/02/16 2044 08/03/16 0500  BP: (!) 156/61 137/69  Pulse: (!) 56 92  Resp: 18 18  Temp: 98.1 F (36.7 C) 97 F (36.1 C)   Vitals:   08/02/16 0413 08/02/16 1323 08/02/16 2044 08/03/16 0500  BP: (!) 153/59 (!) 185/66 (!) 156/61 137/69  Pulse: (!) 56 (!) 59 (!) 56 92  Resp: '20 20 18 18  '$ Temp: 97.9 F (36.6 C) 97.6 F (36.4 C) 98.1 F (36.7 C) 97 F (36.1 C)  TempSrc: Oral Oral Oral Tympanic  SpO2: 95% 95% 94% 100%  Weight:  Height:        General: Pt is alert, awake, not in acute distress Cardiovascular: RRR, S1/S2 +, no rubs, no gallops Respiratory: CTA bilaterally, no wheezing, no  rhonchi Abdominal: Soft, NT, ND, bowel sounds + Extremities: no edema, no cyanosis   The results of significant diagnostics from this hospitalization (including imaging, microbiology, ancillary and laboratory) are listed below for reference.    Significant Diagnostic Studies: Ct Head Wo Contrast  Result Date: 07/30/2016 CLINICAL DATA:  Slurred speech with unsteady gait.  Lung cancer. EXAM: CT HEAD WITHOUT CONTRAST TECHNIQUE: Contiguous axial images were obtained from the base of the skull through the vertex without intravenous contrast. COMPARISON:  None. FINDINGS: Brain: Widespread areas of vasogenic edema are seen throughout both cerebral hemispheres, or extensive on the LEFT. There are suspected underlying isodense masses, representing metastatic disease the brain. There may be early edema in the RIGHT cerebellum. Early LEFT-to-RIGHT shift supratentorial midline, estimated 2-3 mm. No hemorrhage. Small calcification LEFT middle cerebellar peduncle, uncertain significance. Chronic lacunar infarct LEFT thalamus. No incipient herniation. Vascular: No hyperdense vessel or unexpected calcification. Skull: Permeative change in the diploic space of of the calvarium, concerning for metastatic disease. Permeative change also appears to involve clivus. Sinuses/Orbits: No layering fluid orbital mass. Other: None. IMPRESSION: Widespread areas of vasogenic edema on this noncontrast exam, suspected intracranial metastatic disease from lung cancer. MRI brain without and with contrast recommended for further evaluation. Findings discussed with ordering provider team. Suspected osseous disease to the calvarium and skull base. Electronically Signed   By: Staci Righter M.D.   On: 07/30/2016 15:25   Mr Jeri Cos YQ Contrast  Result Date: 07/31/2016 CLINICAL DATA:  Metastatic small cell lung cancer. Acute presentation with dysarthria and confusion. EXAM: MRI HEAD WITHOUT AND WITH CONTRAST TECHNIQUE: Multiplanar, multiecho  pulse sequences of the brain and surrounding structures were obtained without and with intravenous contrast. CONTRAST:  41m MULTIHANCE GADOBENATE DIMEGLUMINE 529 MG/ML IV SOLN COMPARISON:  07/30/2016 CT FINDINGS: Brain: There are multiple supratentorial metastases described below. The brainstem and cerebellum are normal. No evidence of recent ischemic infarction. Mild chronic small-vessel ischemic change of the hemispheric white matter. No hydrocephalus. No extra-axial collection. 2 mm of left-to-right shift because of slightly more prominent edema on the left. Necrotic brain metastases are more conspicuous on diffusion imaging. Because of the necrotic nature, there is thin peripheral enhancement. Lesions are associated with vasogenic edema, slightly more on the left than the right with 2 mm of left-to-right shift. Small amount of blood products associated with the left parietal and right frontal lesions, but no pronounced hemorrhage. Individual lesions as follows. Left thalamus:  21 x 14 mm. Right occipital lobe:  13 x 16 mm. Right frontal lobe, 23 x 25 mm. Right medial parietal lobe, 19 x 14 mm. Right posterior parietal lobe:  6 x 10 mm. Right posterior frontal vertex: 9 mm in diameter. Left temporal/ occipital/ parietal junction:  20 x 16 mm. Left frontal lobe:  22 x 15 mm. Left parietal lobe:  9 mm in diameter. Vascular: Major vessels at the base of the brain show flow. Skull and upper cervical spine: Negative Sinuses/Orbits: Clear/ normal Other: None significant IMPRESSION: Nine supratentorial brain metastases with prominent necrosis and thin peripheral enhancement. Vasogenic edema. Left-to-right midline shift of 2 mm. Few hemorrhagic blood products associated with the left parietal and right frontal lesions. No pronounced hemorrhage. Electronically Signed   By: MNelson ChimesM.D.   On: 07/31/2016 10:38   Dg Esophagus  Result Date: 08/03/2016 CLINICAL DATA:  Dysphagia EXAM: ESOPHOGRAM/BARIUM SWALLOW  TECHNIQUE: Single contrast examination was performed using  thin barium. FLUOROSCOPY TIME:  Fluoroscopy Time:  1.0 minutes Radiation Exposure Index (if provided by the fluoroscopic device): 14.3 mGy Number of Acquired Spot Images: 0 COMPARISON:  None. FINDINGS: Fluoroscopic evaluation of swallowing shows normal peristalsis. No fixed stricture, fold thickening or mass. No reflux with the water siphon maneuver. The patient swallowed a 13 mm barium tablet which freely passed into the stomach. IMPRESSION: Unremarkable study. Electronically Signed   By: Rolm Baptise M.D.   On: 08/03/2016 10:36   Dg Swallowing Func-speech Pathology  Result Date: 07/31/2016 Objective Swallowing Evaluation: Type of Study: MBS-Modified Barium Swallow Study Patient Details Name: Ashley Rowe MRN: [id number] Date of Birth: February 24, 1941 Today's Date: 07/31/2016 Time: SLP Start Time (ACUTE ONLY): 1210-SLP Stop Time (ACUTE ONLY): 1227 SLP Time Calculation (min) (ACUTE ONLY): 17 min Past Medical History: Past Medical History: Diagnosis Date . Lung cancer [facility])  Past Surgical History: Past Surgical History: Procedure Laterality Date . BACK SURGERY   HPI: Aasiyah Auerbach a 76 y.o.femalewith medical history significant of lung cancer treated at [facility].  She thinks finished treatment in January 2017.  She comes in with trouble speaking since yesterday. She denies any other neurological symptoms. Not drooling. No numbness/tingling anywhere. No headache or blurred vision. She went to [facility] and CT head shows widespread areas of vasogenic edema, suspected intracranial metastic disease from known lung cancer.  Pt therefore transferred here for further treatment and evaluation.   Nursing reported that the patient has crackles at the left lung base that are new.  Patient c/o issues swallwoing with material sticking.  She is vague as to whether symtoms are worse with food or liquids.   Subjective: The patient was seen sitting upright in bed  for swallowing evaluation.  Assessment / Plan / Recommendation CHL IP CLINICAL IMPRESSIONS 07/31/2016 Clinical Impression Pt exhibited mild-moderate oral dysphagia marked by decreased cohesion with premature sublingal and vallecular spill, oral holding, delayed transit and mild lingual residue. Pharyngeal phase remarkable for min-mild intermittent vallecular residue clearing with spontaneous second swallows. Flash penetration x 1 with first trial of thin due to incomplete laryngeal closure. Barium pill appeared to hesitate briefly upper-mid esophagus before descending without assist. Suspect upper pharyngeal globus sensation may be due to esophageal component. Recommend pt continue regular texture, thin liquids, pills with water or if difficulty whole in puree.   SLP Visit Diagnosis Dysphagia, oropharyngeal phase (R13.12) Attention and concentration deficit following -- Frontal lobe and executive function deficit following -- Impact on safety and function (No Data)   CHL IP TREATMENT RECOMMENDATION 07/31/2016 Treatment Recommendations Therapy as outlined in treatment plan below   Prognosis 07/31/2016 Prognosis for Safe Diet Advancement Fair Barriers to Reach Goals -- Barriers/Prognosis Comment -- CHL IP DIET RECOMMENDATION 07/31/2016 SLP Diet Recommendations Regular solids;Thin liquid Liquid Administration via Cup;Straw Medication Administration Whole meds with liquid Compensations Slow rate;Small sips/bites Postural Changes Seated upright at 90 degrees   CHL IP OTHER RECOMMENDATIONS 07/31/2016 Recommended Consults -- Oral Care Recommendations Oral care BID Other Recommendations --   CHL IP FOLLOW UP RECOMMENDATIONS 07/31/2016 Follow up Recommendations (No Data)   CHL IP FREQUENCY AND DURATION 07/31/2016 Speech Therapy Frequency (ACUTE ONLY) min 2x/week Treatment Duration 2 weeks      CHL IP ORAL PHASE 07/31/2016 Oral Phase Impaired Oral - Pudding Teaspoon -- Oral - Pudding Cup -- Oral - Honey Teaspoon -- Oral - Honey Cup  --  Oral - Nectar Teaspoon -- Oral - Nectar Cup -- Oral - Nectar Straw -- Oral - Thin Teaspoon -- Oral - Thin Cup Right anterior bolus loss;Holding of bolus;Premature spillage;Decreased bolus cohesion;Delayed oral transit;Lingual/palatal residue Oral - Thin Straw Right anterior bolus loss;Holding of bolus;Premature spillage;Decreased bolus cohesion;Delayed oral transit;Lingual/palatal residue Oral - Puree -- Oral - Mech Soft -- Oral - Regular NT Oral - Multi-Consistency -- Oral - Pill Delayed oral transit;Decreased bolus cohesion Oral Phase - Comment --  CHL IP PHARYNGEAL PHASE 07/31/2016 Pharyngeal Phase Impaired Pharyngeal- Pudding Teaspoon -- Pharyngeal -- Pharyngeal- Pudding Cup -- Pharyngeal -- Pharyngeal- Honey Teaspoon -- Pharyngeal -- Pharyngeal- Honey Cup -- Pharyngeal -- Pharyngeal- Nectar Teaspoon -- Pharyngeal -- Pharyngeal- Nectar Cup -- Pharyngeal -- Pharyngeal- Nectar Straw -- Pharyngeal -- Pharyngeal- Thin Teaspoon -- Pharyngeal -- Pharyngeal- Thin Cup Penetration/Aspiration during swallow;Pharyngeal residue - valleculae;Pharyngeal residue - pyriform;Reduced laryngeal elevation Pharyngeal Material enters airway, remains ABOVE vocal cords then ejected out Pharyngeal- Thin Straw -- Pharyngeal -- Pharyngeal- Puree -- Pharyngeal -- Pharyngeal- Mechanical Soft -- Pharyngeal -- Pharyngeal- Regular WFL Pharyngeal -- Pharyngeal- Multi-consistency -- Pharyngeal -- Pharyngeal- Pill WFL Pharyngeal -- Pharyngeal Comment --  CHL IP CERVICAL ESOPHAGEAL PHASE 07/31/2016 Cervical Esophageal Phase WFL Pudding Teaspoon -- Pudding Cup -- Honey Teaspoon -- Honey Cup -- Nectar Teaspoon -- Nectar Cup -- Nectar Straw -- Thin Teaspoon -- Thin Cup -- Thin Straw -- Puree -- Mechanical Soft -- Regular -- Multi-consistency -- Pill -- Cervical Esophageal Comment -- No flowsheet data found. Houston Siren 07/31/2016, 5:08 PM  Orbie Pyo Colvin Caroli.Ed CCC-SLP Pager [phone]               Microbiology: No results found for  this or any previous visit (from the past 240 hour(s)).   Labs: Basic Metabolic Panel:  Recent Labs Lab 07/30/16 1459 08/02/16 0525 08/03/16 0548  NA 138 138 137  K 4.1 4.4 4.2  CL 101 102 98*  CO2 '27 27 27  '$ GLUCOSE 148* 190* 189*  BUN 19 32* 36*  CREATININE 0.84 0.94 0.92  CALCIUM 9.1 8.9 8.9   Liver Function Tests:  Recent Labs Lab 07/30/16 1459  AST 19  ALT 11*  ALKPHOS 54  BILITOT 0.3  PROT 7.1  ALBUMIN 3.7   No results for input(s): LIPASE, AMYLASE in the last 168 hours. No results for input(s): AMMONIA in the last 168 hours. CBC:  Recent Labs Lab 07/30/16 1459 08/02/16 0525  WBC 10.3 22.4*  NEUTROABS 6.8  --   HGB 11.6* 11.3*  HCT 35.8* 35.5*  MCV 96.0 93.4  PLT 224 230   Cardiac Enzymes:  Recent Labs Lab 07/30/16 1459  TROPONINI <0.03   BNP: Invalid input(s): POCBNP CBG:  Recent Labs Lab 07/30/16 1457  GLUCAP 139*    Time coordinating discharge:  Greater than 30 minutes  Signed:  Mamye Bolds, DO [facility] Pager: [phone] 08/03/2016, 11:28 AM

## 2016-08-03 NOTE — Progress Notes (Signed)
Ashley Rowe   JJH:[birth year]   KG#:[id number]   FWY#:[id number]  Subjective: patient is in bed, TV on; she seems comfortable, denies pain, denies headaches, N/V; aware she was confused but isn't now; aware that her lung cancer had "jumped to my brain" and that she will receive radiation to the brain to control this; no family in room   Objective: older White woman examined in bed Vitals:   08/02/16 2044 08/03/16 0500  BP: (!) 156/61 137/69  Pulse: (!) 56 92  Resp: 18 18  Temp: 98.1 F (36.7 C) 97 F (36.1 C)    Body mass index is 26.4 kg/m.  Intake/Output Summary (Last 24 hours) at 08/03/16 0814 Last data filed at 08/03/16 0600  Gross per 24 hour  Intake              300 ml  Output                0 ml  Net              300 ml    Lungs no rales or wheezes--auscultated anterolaterally  Heart regular rate and rhythm  Abdomen soft, +BS  Neuro nonfocal, well-oriented, cooperative affect    CBG (last 3)  No results for input(s): GLUCAP in the last 72 hours.   Labs:  Lab Results  Component Value Date   WBC 22.4 (H) 08/02/2016   HGB 11.3 (L) 08/02/2016   HCT 35.5 (L) 08/02/2016   MCV 93.4 08/02/2016   PLT 230 08/02/2016   NEUTROABS 6.8 07/30/2016    '@LASTCHEMISTRY'$ @  Urine Studies No results for input(s): UHGB, CRYS in the last 72 hours.  Invalid input(s): UACOL, UAPR, USPG, UPH, UTP, UGL, UKET, UBIL, UNIT, UROB, ULEU, UEPI, UWBC, URBC, UBAC, CAST, [place], [place]  Basic Metabolic Panel:  Recent Labs Lab 07/30/16 1459 08/02/16 0525 08/03/16 0548  NA 138 138 137  K 4.1 4.4 4.2  CL 101 102 98*  CO2 '27 27 27  '$ GLUCOSE 148* 190* 189*  BUN 19 32* 36*  CREATININE 0.84 0.94 0.92  CALCIUM 9.1 8.9 8.9   GFR Estimated Creatinine Clearance: 47.3 mL/min (by C-G formula based on SCr of 0.92 mg/dL). Liver Function Tests:  Recent Labs Lab 07/30/16 1459  AST 19  ALT 11*  ALKPHOS 54  BILITOT 0.3  PROT 7.1  ALBUMIN 3.7   No results for input(s): LIPASE, AMYLASE in the  last 168 hours. No results for input(s): AMMONIA in the last 168 hours. Coagulation profile  Recent Labs Lab 07/30/16 1459  INR 0.97    CBC:  Recent Labs Lab 07/30/16 1459 08/02/16 0525  WBC 10.3 22.4*  NEUTROABS 6.8  --   HGB 11.6* 11.3*  HCT 35.8* 35.5*  MCV 96.0 93.4  PLT 224 230   Cardiac Enzymes:  Recent Labs Lab 07/30/16 1459  TROPONINI <0.03   BNP: Invalid input(s): POCBNP CBG:  Recent Labs Lab 07/30/16 1457  GLUCAP 139*   D-Dimer No results for input(s): DDIMER in the last 72 hours. Hgb A1c No results for input(s): HGBA1C in the last 72 hours. Lipid Profile No results for input(s): CHOL, HDL, LDLCALC, TRIG, CHOLHDL, LDLDIRECT in the last 72 hours. Thyroid function studies No results for input(s): TSH, T4TOTAL, T3FREE, THYROIDAB in the last 72 hours.  Invalid input(s): FREET3 Anemia work up No results for input(s): VITAMINB12, FOLATE, FERRITIN, TIBC, IRON, RETICCTPCT in the last 72 hours. Microbiology No results found for this or any previous visit (from the past 240  hour(s)).    Studies:  Ct Head Wo Contrast  Result Date: 07/30/2016 CLINICAL DATA:  Slurred speech with unsteady gait.  Lung cancer. EXAM: CT HEAD WITHOUT CONTRAST TECHNIQUE: Contiguous axial images were obtained from the base of the skull through the vertex without intravenous contrast. COMPARISON:  None. FINDINGS: Brain: Widespread areas of vasogenic edema are seen throughout both cerebral hemispheres, or extensive on the LEFT. There are suspected underlying isodense masses, representing metastatic disease the brain. There may be early edema in the RIGHT cerebellum. Early LEFT-to-RIGHT shift supratentorial midline, estimated 2-3 mm. No hemorrhage. Small calcification LEFT middle cerebellar peduncle, uncertain significance. Chronic lacunar infarct LEFT thalamus. No incipient herniation. Vascular: No hyperdense vessel or unexpected calcification. Skull: Permeative change in the diploic  space of of the calvarium, concerning for metastatic disease. Permeative change also appears to involve clivus. Sinuses/Orbits: No layering fluid orbital mass. Other: None. IMPRESSION: Widespread areas of vasogenic edema on this noncontrast exam, suspected intracranial metastatic disease from lung cancer. MRI brain without and with contrast recommended for further evaluation. Findings discussed with ordering provider team. Suspected osseous disease to the calvarium and skull base. Electronically Signed   By: Staci Righter M.D.   On: 07/30/2016 15:25   Mr Jeri Cos JM Contrast  Result Date: 07/31/2016 CLINICAL DATA:  Metastatic small cell lung cancer. Acute presentation with dysarthria and confusion. EXAM: MRI HEAD WITHOUT AND WITH CONTRAST TECHNIQUE: Multiplanar, multiecho pulse sequences of the brain and surrounding structures were obtained without and with intravenous contrast. CONTRAST:  59m MULTIHANCE GADOBENATE DIMEGLUMINE 529 MG/ML IV SOLN COMPARISON:  07/30/2016 CT FINDINGS: Brain: There are multiple supratentorial metastases described below. The brainstem and cerebellum are normal. No evidence of recent ischemic infarction. Mild chronic small-vessel ischemic change of the hemispheric white matter. No hydrocephalus. No extra-axial collection. 2 mm of left-to-right shift because of slightly more prominent edema on the left. Necrotic brain metastases are more conspicuous on diffusion imaging. Because of the necrotic nature, there is thin peripheral enhancement. Lesions are associated with vasogenic edema, slightly more on the left than the right with 2 mm of left-to-right shift. Small amount of blood products associated with the left parietal and right frontal lesions, but no pronounced hemorrhage. Individual lesions as follows. Left thalamus:  21 x 14 mm. Right occipital lobe:  13 x 16 mm. Right frontal lobe, 23 x 25 mm. Right medial parietal lobe, 19 x 14 mm. Right posterior parietal lobe:  6 x 10 mm.  Right posterior frontal vertex: 9 mm in diameter. Left temporal/ occipital/ parietal junction:  20 x 16 mm. Left frontal lobe:  22 x 15 mm. Left parietal lobe:  9 mm in diameter. Vascular: Major vessels at the base of the brain show flow. Skull and upper cervical spine: Negative Sinuses/Orbits: Clear/ normal Other: None significant IMPRESSION: Nine supratentorial brain metastases with prominent necrosis and thin peripheral enhancement. Vasogenic edema. Left-to-right midline shift of 2 mm. Few hemorrhagic blood products associated with the left parietal and right frontal lesions. No pronounced hemorrhage. Electronically Signed   By: MNelson ChimesM.D.   On: 07/31/2016 10:38   Dg Swallowing Func-speech Pathology  Result Date: 07/31/2016 Objective Swallowing Evaluation: Type of Study: MBS-Modified Barium Swallow Study Patient Details Name: AJack MineauMRN: [id number] of Birth: 309-01-1942Today's Date: 07/31/2016 Time: SLP Start Time (ACUTE ONLY): 1210-SLP Stop Time (ACUTE ONLY): 1227 SLP Time Calculation (min) (ACUTE ONLY): 17 min Past Medical History: Past Medical History: Diagnosis Date . Lung cancer ([facility]  Past Surgical  History: Past Surgical History: Procedure Laterality Date . BACK SURGERY   HPI: Ashley Rowe a 76 y.o.femalewith medical history significant of lung cancer treated at [facility].  She thinks finished treatment in January 2017.  She comes in with trouble speaking since yesterday. She denies any other neurological symptoms. Not drooling. No numbness/tingling anywhere. No headache or blurred vision. She went to [facility] and CT head shows widespread areas of vasogenic edema, suspected intracranial metastic disease from known lung cancer.  Pt therefore transferred here for further treatment and evaluation.   Nursing reported that the patient has crackles at the left lung base that are new.  Patient c/o issues swallwoing with material sticking.  She is vague as to whether symtoms are  worse with food or liquids.   Subjective: The patient was seen sitting upright in bed for swallowing evaluation.  Assessment / Plan / Recommendation CHL IP CLINICAL IMPRESSIONS 07/31/2016 Clinical Impression Pt exhibited mild-moderate oral dysphagia marked by decreased cohesion with premature sublingal and vallecular spill, oral holding, delayed transit and mild lingual residue. Pharyngeal phase remarkable for min-mild intermittent vallecular residue clearing with spontaneous second swallows. Flash penetration x 1 with first trial of thin due to incomplete laryngeal closure. Barium pill appeared to hesitate briefly upper-mid esophagus before descending without assist. Suspect upper pharyngeal globus sensation may be due to esophageal component. Recommend pt continue regular texture, thin liquids, pills with water or if difficulty whole in puree.   SLP Visit Diagnosis Dysphagia, oropharyngeal phase (R13.12) Attention and concentration deficit following -- Frontal lobe and executive function deficit following -- Impact on safety and function (No Data)   CHL IP TREATMENT RECOMMENDATION 07/31/2016 Treatment Recommendations Therapy as outlined in treatment plan below   Prognosis 07/31/2016 Prognosis for Safe Diet Advancement Fair Barriers to Reach Goals -- Barriers/Prognosis Comment -- CHL IP DIET RECOMMENDATION 07/31/2016 SLP Diet Recommendations Regular solids;Thin liquid Liquid Administration via Cup;Straw Medication Administration Whole meds with liquid Compensations Slow rate;Small sips/bites Postural Changes Seated upright at 90 degrees   CHL IP OTHER RECOMMENDATIONS 07/31/2016 Recommended Consults -- Oral Care Recommendations Oral care BID Other Recommendations --   CHL IP FOLLOW UP RECOMMENDATIONS 07/31/2016 Follow up Recommendations (No Data)   CHL IP FREQUENCY AND DURATION 07/31/2016 Speech Therapy Frequency (ACUTE ONLY) min 2x/week Treatment Duration 2 weeks      CHL IP ORAL PHASE 07/31/2016 Oral Phase Impaired Oral -  Pudding Teaspoon -- Oral - Pudding Cup -- Oral - Honey Teaspoon -- Oral - Honey Cup -- Oral - Nectar Teaspoon -- Oral - Nectar Cup -- Oral - Nectar Straw -- Oral - Thin Teaspoon -- Oral - Thin Cup Right anterior bolus loss;Holding of bolus;Premature spillage;Decreased bolus cohesion;Delayed oral transit;Lingual/palatal residue Oral - Thin Straw Right anterior bolus loss;Holding of bolus;Premature spillage;Decreased bolus cohesion;Delayed oral transit;Lingual/palatal residue Oral - Puree -- Oral - Mech Soft -- Oral - Regular NT Oral - Multi-Consistency -- Oral - Pill Delayed oral transit;Decreased bolus cohesion Oral Phase - Comment --  CHL IP PHARYNGEAL PHASE 07/31/2016 Pharyngeal Phase Impaired Pharyngeal- Pudding Teaspoon -- Pharyngeal -- Pharyngeal- Pudding Cup -- Pharyngeal -- Pharyngeal- Honey Teaspoon -- Pharyngeal -- Pharyngeal- Honey Cup -- Pharyngeal -- Pharyngeal- Nectar Teaspoon -- Pharyngeal -- Pharyngeal- Nectar Cup -- Pharyngeal -- Pharyngeal- Nectar Straw -- Pharyngeal -- Pharyngeal- Thin Teaspoon -- Pharyngeal -- Pharyngeal- Thin Cup Penetration/Aspiration during swallow;Pharyngeal residue - valleculae;Pharyngeal residue - pyriform;Reduced laryngeal elevation Pharyngeal Material enters airway, remains ABOVE vocal cords then ejected out Pharyngeal- Thin Straw -- Pharyngeal --  Pharyngeal- Puree -- Pharyngeal -- Pharyngeal- Mechanical Soft -- Pharyngeal -- Pharyngeal- Regular WFL Pharyngeal -- Pharyngeal- Multi-consistency -- Pharyngeal -- Pharyngeal- Pill WFL Pharyngeal -- Pharyngeal Comment --  CHL IP CERVICAL ESOPHAGEAL PHASE 07/31/2016 Cervical Esophageal Phase WFL Pudding Teaspoon -- Pudding Cup -- Honey Teaspoon -- Honey Cup -- Nectar Teaspoon -- Nectar Cup -- Nectar Straw -- Thin Teaspoon -- Thin Cup -- Thin Straw -- Puree -- Mechanical Soft -- Regular -- Multi-consistency -- Pill -- Cervical Esophageal Comment -- No flowsheet data found. [place] Siren 07/31/2016, 5:08 PM  Orbie Pyo Colvin Caroli.Ed CCC-SLP Pager [phone]               Assessment: 76 y.o. 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 She understands her lung cancer has spread to her brain and that she will receive 2 weeks of whole brain irradiation to control this.  She wishes to follow up with Dr Harlow Asa in [facility] at discharge. Currently she has an appointment with him 09/03/2016. I think it would be useful for him to see her within a few days of discharge so he can manage her steroid taper and discuss further treatment, if any. I suggest his office be contacted at discharge  Patient has not asked questions about prognosis. I have discussed this with the patient's daughter and they understand  Survival from this point is frequently a matter of months, but sometimes patients have a particularly good response to radiation and we can hope that will be the case here, I have placed a SW referral for HCPOA documents to be completed and this was referred to [place] services-- it does not appear this has been completed. Will write the 2d referral today.  Radiation treatments can all be done as outpatient if you feel the patient and family are ready for discharge. I am not planning to follow this patient as she wishes to return to her oncologist. Will follow peripherally from this point.  Please let me know if I can be of further help  Chauncey Cruel, MD 08/03/2016  8:14 AM Medical Oncology and  Hematology [facility] [address] [place], [place] [address] Tel. [phone]    Fax. [phone]

## 2016-08-04 ENCOUNTER — Ambulatory Visit
Admission: RE | Admit: 2016-08-04 | Discharge: 2016-08-04 | Disposition: A | Discharge status: Home / Self Care | Payer: Medicare Other | Source: Ambulatory Visit | Attending: Radiation Oncology | Admitting: Radiation Oncology

## 2016-08-04 DIAGNOSIS — Z510 Encounter for antineoplastic radiation therapy: Secondary | ICD-10-CM | POA: Diagnosis present

## 2016-08-04 DIAGNOSIS — C7931 Secondary malignant neoplasm of brain: Secondary | ICD-10-CM | POA: Diagnosis not present

## 2016-08-04 LAB — HEMOGLOBIN A1C
HEMOGLOBIN A1C: 6.1 % — AB (ref 4.8–5.6)
Mean Plasma Glucose: 128 mg/dL

## 2016-08-05 ENCOUNTER — Ambulatory Visit
Admission: RE | Admit: 2016-08-05 | Discharge: 2016-08-05 | Disposition: A | Discharge status: Home / Self Care | Payer: Medicare Other | Source: Ambulatory Visit | Attending: Radiation Oncology | Admitting: Radiation Oncology

## 2016-08-05 DIAGNOSIS — Z510 Encounter for antineoplastic radiation therapy: Secondary | ICD-10-CM | POA: Diagnosis not present

## 2016-08-06 ENCOUNTER — Ambulatory Visit
Admission: RE | Admit: 2016-08-06 | Discharge: 2016-08-06 | Disposition: A | Discharge status: Home / Self Care | Payer: Medicare Other | Source: Ambulatory Visit | Attending: Radiation Oncology | Admitting: Radiation Oncology

## 2016-08-06 DIAGNOSIS — Z510 Encounter for antineoplastic radiation therapy: Secondary | ICD-10-CM | POA: Diagnosis not present

## 2016-08-07 ENCOUNTER — Ambulatory Visit
Admission: RE | Admit: 2016-08-07 | Discharge: 2016-08-07 | Disposition: A | Discharge status: Home / Self Care | Payer: Medicare Other | Source: Ambulatory Visit | Attending: Radiation Oncology | Admitting: Radiation Oncology

## 2016-08-07 DIAGNOSIS — Z510 Encounter for antineoplastic radiation therapy: Secondary | ICD-10-CM | POA: Diagnosis not present

## 2016-08-07 DIAGNOSIS — C7931 Secondary malignant neoplasm of brain: Secondary | ICD-10-CM

## 2016-08-07 MED ORDER — SONAFINE EX EMUL
1.0000 "application " | Freq: Two times a day (BID) | CUTANEOUS | Status: DC
  Administered 2016-08-07: 1 via TOPICAL

## 2016-08-10 ENCOUNTER — Ambulatory Visit
Admission: RE | Admit: 2016-08-10 | Discharge: 2016-08-10 | Disposition: A | Discharge status: Home / Self Care | Payer: Medicare Other | Source: Ambulatory Visit | Attending: Radiation Oncology | Admitting: Radiation Oncology

## 2016-08-10 DIAGNOSIS — Z510 Encounter for antineoplastic radiation therapy: Secondary | ICD-10-CM | POA: Diagnosis not present

## 2016-08-11 ENCOUNTER — Ambulatory Visit
Admission: RE | Admit: 2016-08-11 | Discharge: 2016-08-11 | Disposition: A | Discharge status: Home / Self Care | Payer: Medicare Other | Source: Ambulatory Visit | Attending: Radiation Oncology | Admitting: Radiation Oncology

## 2016-08-11 DIAGNOSIS — Z510 Encounter for antineoplastic radiation therapy: Secondary | ICD-10-CM | POA: Diagnosis not present

## 2016-08-12 ENCOUNTER — Ambulatory Visit
Admission: RE | Admit: 2016-08-12 | Discharge: 2016-08-12 | Disposition: A | Discharge status: Home / Self Care | Payer: Medicare Other | Source: Ambulatory Visit | Attending: Radiation Oncology | Admitting: Radiation Oncology

## 2016-08-12 DIAGNOSIS — Z510 Encounter for antineoplastic radiation therapy: Secondary | ICD-10-CM | POA: Diagnosis not present

## 2016-08-13 ENCOUNTER — Ambulatory Visit
Admission: RE | Admit: 2016-08-13 | Discharge: 2016-08-13 | Disposition: A | Discharge status: Home / Self Care | Payer: Medicare Other | Source: Ambulatory Visit | Attending: Radiation Oncology | Admitting: Radiation Oncology

## 2016-08-13 DIAGNOSIS — Z510 Encounter for antineoplastic radiation therapy: Secondary | ICD-10-CM | POA: Diagnosis not present

## 2016-08-13 DIAGNOSIS — C7931 Secondary malignant neoplasm of brain: Secondary | ICD-10-CM

## 2016-08-13 MED ORDER — BIAFINE EX EMUL
Freq: Every day | CUTANEOUS | Status: DC
  Administered 2016-08-13: 17:00:00 via TOPICAL

## 2016-08-14 ENCOUNTER — Encounter: Payer: Self-pay | Admitting: Radiation Oncology

## 2016-08-14 ENCOUNTER — Ambulatory Visit
Admission: RE | Admit: 2016-08-14 | Discharge: 2016-08-14 | Disposition: A | Discharge status: Home / Self Care | Payer: Medicare Other | Source: Ambulatory Visit | Attending: Radiation Oncology | Admitting: Radiation Oncology

## 2016-08-14 DIAGNOSIS — Z510 Encounter for antineoplastic radiation therapy: Secondary | ICD-10-CM | POA: Diagnosis not present

## 2016-08-25 NOTE — Progress Notes (Signed)
  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  The patient was immobilized with a thermoplastic mask and isocenter was verified with weekly port films.  Narrative: The patient tolerated radiation treatment relatively well.  The patient denied headaches, vision changes, nausea, dizziness, or light headedness. She complained of heartburn.  Plan: The patient has completed radiation treatment. The patient will return to radiation oncology clinic for routine followup in one month. I advised them to call or return sooner if they have any questions or concerns related to their recovery or treatment. ________________________________ ------------------------------------------------  Jodelle Gross, MD, PhD  This document serves as a record of services personally performed by Kyung Rudd, MD. It was created on his behalf by Darcus Austin, a trained medical scribe. The creation of this record is based on the scribe's personal observations and the provider's statements to them. This document has been checked and approved by the attending provider.

## 2016-09-24 ENCOUNTER — Ambulatory Visit: Payer: Self-pay | Admitting: Radiation Oncology

## 2016-09-29 ENCOUNTER — Ambulatory Visit: Admission: RE | Admit: 2016-09-29 | Payer: Medicare Other | Source: Ambulatory Visit | Admitting: Radiation Oncology

## 2016-10-12 ENCOUNTER — Encounter: Payer: Self-pay | Admitting: *Deleted

## 2016-10-19 ENCOUNTER — Ambulatory Visit
Admission: RE | Admit: 2016-10-19 | Discharge: 2016-10-19 | Disposition: A | Discharge status: Home / Self Care | Payer: Medicare Other | Source: Ambulatory Visit | Attending: Radiation Oncology | Admitting: Radiation Oncology

## 2016-10-19 NOTE — Progress Notes (Addendum)
[place] with daughter Ronnald Nian who reported her mother was hospitalized in [facility] last Thursday, October 15, 2016 because of diabetes and elevated blood sugars.  Mrs. Ronalee Belts will reschedule her mother's one month follow up appointment when her mother is discharged and is stable enough to travel for the one month follow up appointment.  The mention information reported to Shona Simpson, P.A. For Dr. Lisbeth Renshaw and appointment cancelled for today.

## 2016-11-21 DEATH — deceased

## 2018-07-06 IMAGING — MR MR HEAD WO/W CM
10 of 13 series · 34 of 48 positions shown · IV contrast (multihance)
Comparison: 07/30/2016 CT

CLINICAL DATA: Metastatic small cell lung cancer. Acute
presentation with dysarthria and confusion.

EXAM:
MRI HEAD WITHOUT AND WITH CONTRAST
TECHNIQUE: Multiplanar, multiecho pulse sequences of the brain and surrounding
structures were obtained without and with intravenous contrast.
CONTRAST:  14mL MULTIHANCE GADOBENATE DIMEGLUMINE 529 MG/ML IV SOLN

[Series 3: T1 · sagittal · 5.0mm · 0.47mm/px · 1 of 24 slices shown]
[im 1/24]
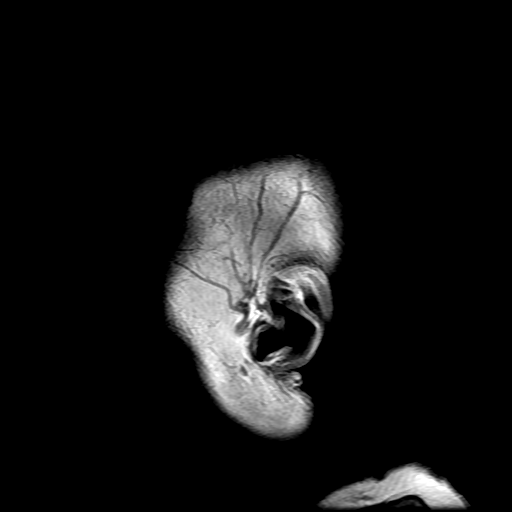

[Series 4: DWI · axial · 3.0mm · 1.09mm/px · z∈[-65,+91]mm · 9 of 106 slices shown (1 of 4)]
[im 1/106]
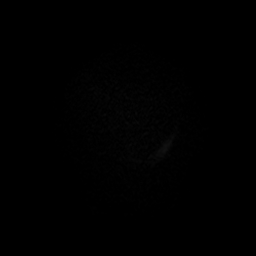
[im 14/106]
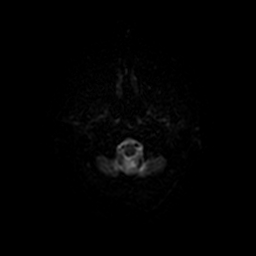
[im 27/106]
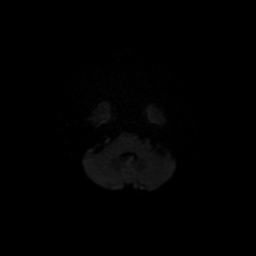
[im 40/106]
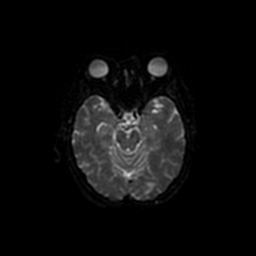
[im 53/106]
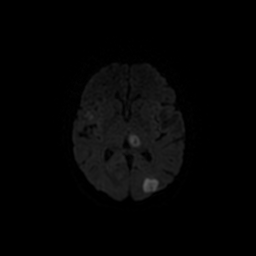
[im 66/106]
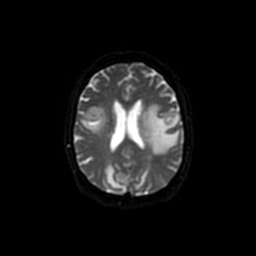
[im 79/106]
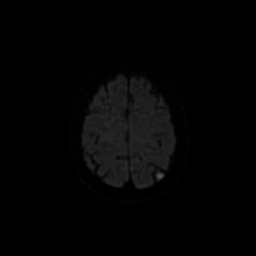
[im 92/106]
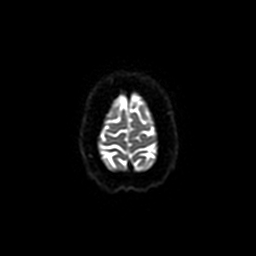
[im 106/106]
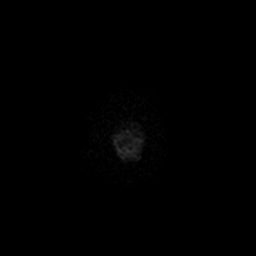

[Series 5: DWI · coronal · 5.0mm · 1.09mm/px · 6 of 62 slices shown (2 of 4)]
[im 1/62]
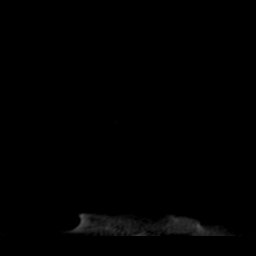
[im 13/62]
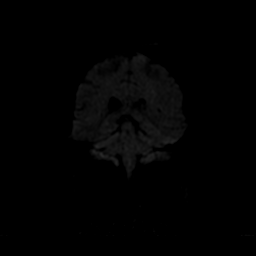
[im 25/62]
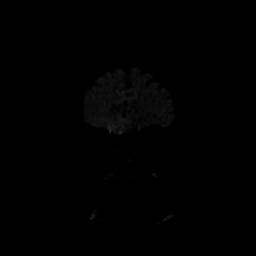
[im 37/62]
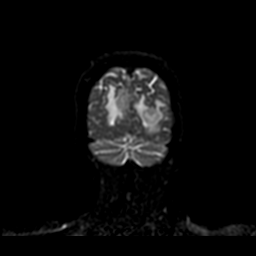
[im 49/62]
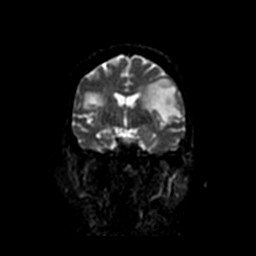
[im 62/62]
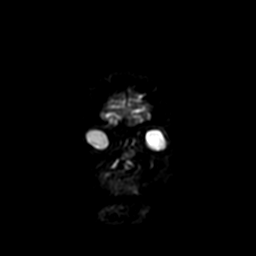

[Series 6: T2 · axial · 5.0mm · 0.43mm/px · z∈[-65,+91]mm · 2 of 27 slices shown]
[im 1/27]
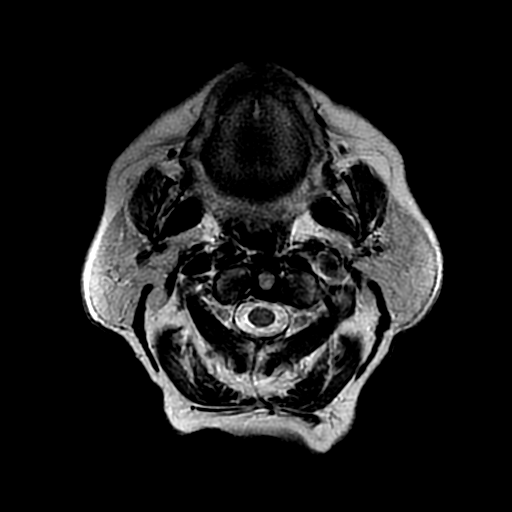
[im 27/27]
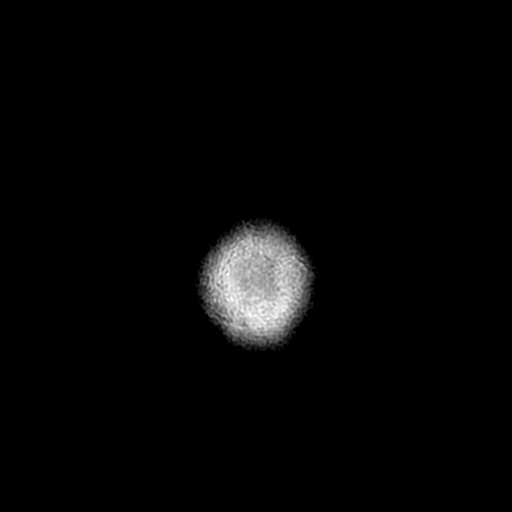

[Series 7: FLAIR · axial · 3.0mm · 0.43mm/px · z∈[-65,+91]mm · 2 of 27 slices shown]
[im 1/27]
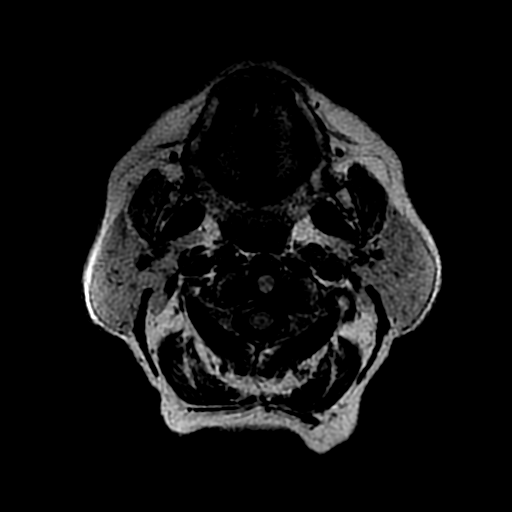
[im 27/27]
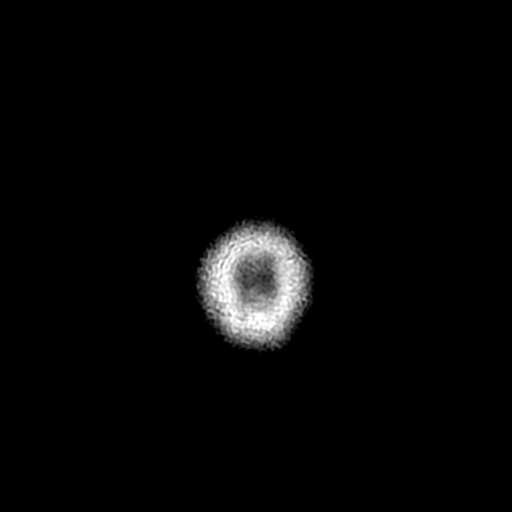

[Series 10: T2 post-contrast · coronal · 5.0mm · 0.45mm/px · 2 of 25 slices shown]
[im 1/25]
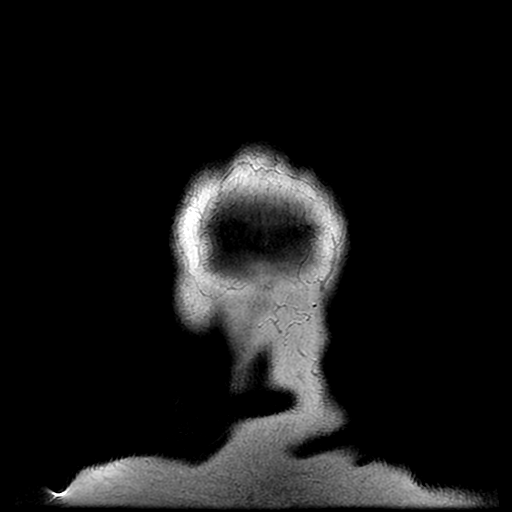
[im 25/25]
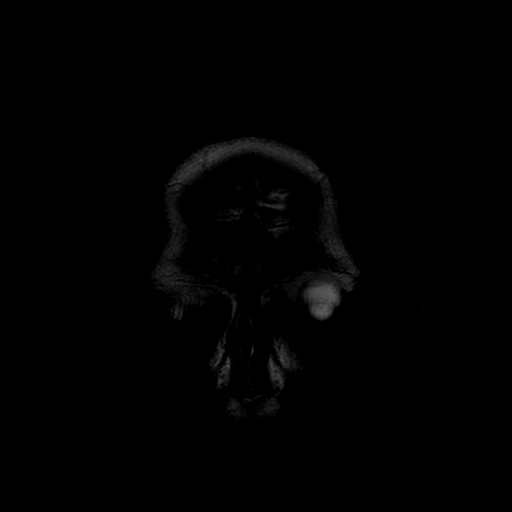

[Series 12: T1 post-contrast · coronal · 5.0mm · 0.45mm/px · 2 of 25 slices shown (1 of 2)]
[im 1/25]
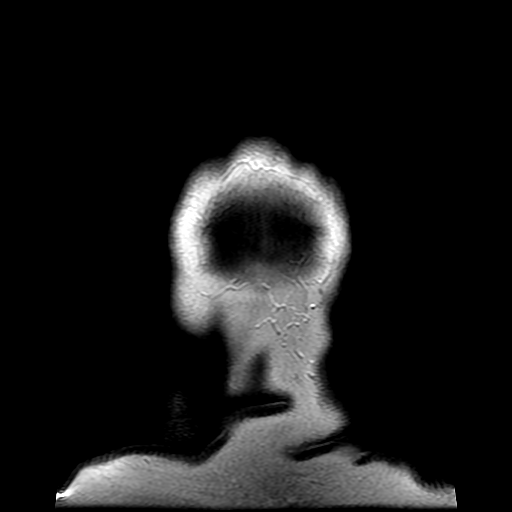
[im 25/25]
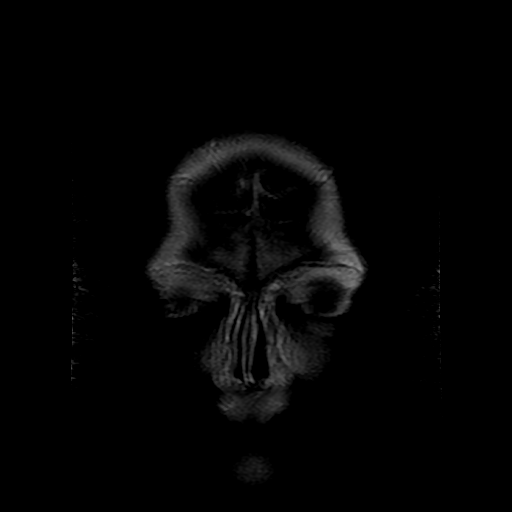

[Series 13: T1 post-contrast · sagittal · 5.0mm · 0.47mm/px · 2 of 24 slices shown (2 of 2)]
[im 1/24]
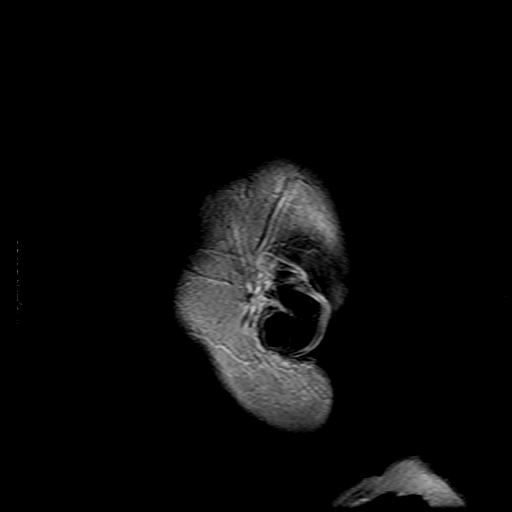
[im 24/24]
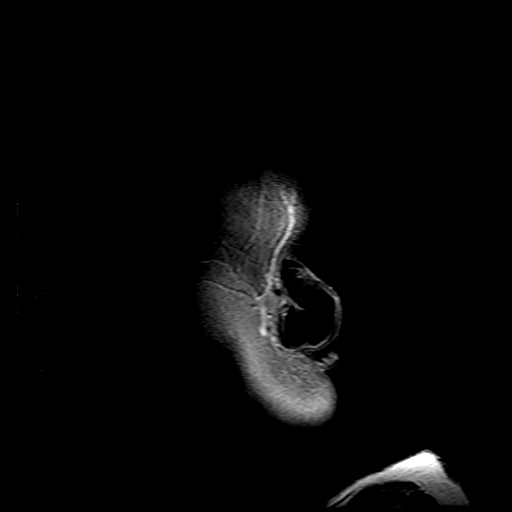

[Series 400: DWI · axial · 3.0mm · 1.09mm/px · z∈[-65,+91]mm · 5 of 53 slices shown (3 of 4)]
[im 1/53]
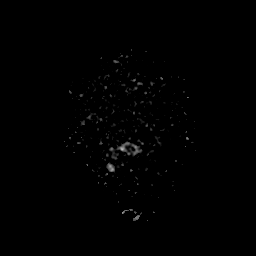
[im 14/53]
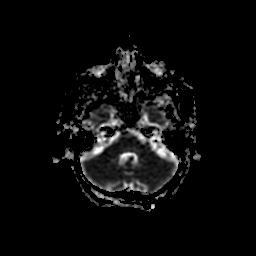
[im 27/53]
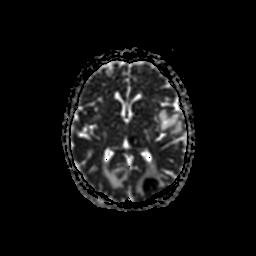
[im 40/53]
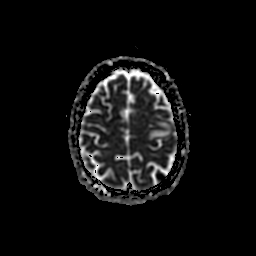
[im 53/53]
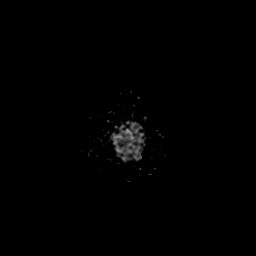

[Series 500: DWI · coronal · 5.0mm · 1.09mm/px · 3 of 31 slices shown (4 of 4)]
[im 1/31]
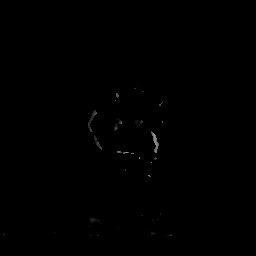
[im 16/31]
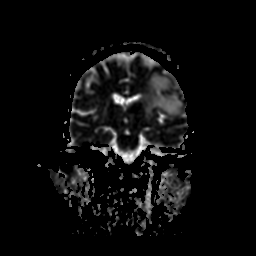
[im 31/31]
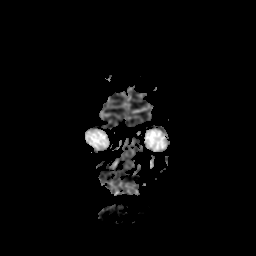

[34 of 48 positions shown; findings below may reference images not displayed]

FINDINGS: Brain: There are multiple supratentorial metastases described below.
The brainstem and cerebellum are normal. No evidence of recent
ischemic infarction. Mild chronic small-vessel ischemic change of
the hemispheric white matter. No hydrocephalus. No extra-axial
collection. 2 mm of left-to-right shift because of slightly more
prominent edema on the left.

Necrotic brain metastases are more conspicuous on diffusion imaging.
Because of the necrotic nature, there is thin peripheral
enhancement. Lesions are associated with vasogenic edema, slightly
more on the left than the right with 2 mm of left-to-right shift.
Small amount of blood products associated with the left parietal and
right frontal lesions, but no pronounced hemorrhage. Individual
lesions as follows.

Left thalamus:  21 x 14 mm.

Right occipital lobe:  13 x 16 mm.

Right frontal lobe, 23 x 25 mm.

Right medial parietal lobe, 19 x 14 mm.

Right posterior parietal lobe:  6 x 10 mm.

Right posterior frontal vertex: 9 mm in diameter.

Left temporal/ occipital/ parietal junction:  20 x 16 mm.

Left frontal lobe:  22 x 15 mm.

Left parietal lobe:  9 mm in diameter.

Vascular: Major vessels at the base of the brain show flow.

Skull and upper cervical spine: Negative

Sinuses/Orbits: Clear/ normal

Other: None significant
IMPRESSION: Nine supratentorial brain metastases with prominent necrosis and
thin peripheral enhancement. Vasogenic edema. Left-to-right midline
shift of 2 mm. Few hemorrhagic blood products associated with the
left parietal and right frontal lesions. No pronounced hemorrhage.
